# Patient Record
Sex: Female | Born: 1940 | Race: White | Hispanic: No | Smoking: Never smoker
Health system: Southern US, Community
[De-identification: ages and names within clinical notes are randomized; demographics above are authoritative.]

## PROBLEM LIST (undated history)

## (undated) DIAGNOSIS — F0390 Unspecified dementia without behavioral disturbance: Secondary | ICD-10-CM

## (undated) HISTORY — PX: ABDOMINAL HYSTERECTOMY: SHX81

## (undated) HISTORY — PX: APPENDECTOMY: SHX54

---

## 2015-07-25 DIAGNOSIS — M47816 Spondylosis without myelopathy or radiculopathy, lumbar region: Secondary | ICD-10-CM | POA: Diagnosis present

## 2015-11-04 DIAGNOSIS — I10 Essential (primary) hypertension: Secondary | ICD-10-CM | POA: Diagnosis present

## 2019-11-22 DIAGNOSIS — F0390 Unspecified dementia without behavioral disturbance: Secondary | ICD-10-CM | POA: Diagnosis present

## 2021-03-02 ENCOUNTER — Other Ambulatory Visit: Payer: Self-pay

## 2021-03-02 ENCOUNTER — Encounter: Payer: Self-pay | Admitting: Emergency Medicine

## 2021-03-02 ENCOUNTER — Emergency Department
Admission: EM | Admit: 2021-03-02 | Discharge: 2021-03-02 | Disposition: A | Discharge status: Home / Self Care | Payer: Medicare Other | Attending: Emergency Medicine | Admitting: Emergency Medicine

## 2021-03-02 DIAGNOSIS — R531 Weakness: Secondary | ICD-10-CM | POA: Diagnosis not present

## 2021-03-02 DIAGNOSIS — T148XXA Other injury of unspecified body region, initial encounter: Secondary | ICD-10-CM

## 2021-03-02 DIAGNOSIS — S80212A Abrasion, left knee, initial encounter: Secondary | ICD-10-CM | POA: Insufficient documentation

## 2021-03-02 DIAGNOSIS — Y92129 Unspecified place in nursing home as the place of occurrence of the external cause: Secondary | ICD-10-CM | POA: Insufficient documentation

## 2021-03-02 DIAGNOSIS — S8991XA Unspecified injury of right lower leg, initial encounter: Secondary | ICD-10-CM | POA: Diagnosis present

## 2021-03-02 DIAGNOSIS — S81811A Laceration without foreign body, right lower leg, initial encounter: Secondary | ICD-10-CM | POA: Diagnosis not present

## 2021-03-02 DIAGNOSIS — R55 Syncope and collapse: Secondary | ICD-10-CM | POA: Insufficient documentation

## 2021-03-02 DIAGNOSIS — Y9301 Activity, walking, marching and hiking: Secondary | ICD-10-CM | POA: Diagnosis not present

## 2021-03-02 DIAGNOSIS — W19XXXA Unspecified fall, initial encounter: Secondary | ICD-10-CM | POA: Diagnosis not present

## 2021-03-02 LAB — CBC
HCT: 33 % — ABNORMAL LOW (ref 36.0–46.0)
Hemoglobin: 10.7 g/dL — ABNORMAL LOW (ref 12.0–15.0)
MCH: 33.1 pg (ref 26.0–34.0)
MCHC: 32.4 g/dL (ref 30.0–36.0)
MCV: 102.2 fL — ABNORMAL HIGH (ref 80.0–100.0)
Platelets: 259 10*3/uL (ref 150–400)
RBC: 3.23 MIL/uL — ABNORMAL LOW (ref 3.87–5.11)
RDW: 13.5 % (ref 11.5–15.5)
WBC: 9.3 10*3/uL (ref 4.0–10.5)
nRBC: 0 % (ref 0.0–0.2)

## 2021-03-02 LAB — COMPREHENSIVE METABOLIC PANEL
ALT: 9 U/L (ref 0–44)
AST: 20 U/L (ref 15–41)
Albumin: 3.8 g/dL (ref 3.5–5.0)
Alkaline Phosphatase: 43 U/L (ref 38–126)
Anion gap: 2 — ABNORMAL LOW (ref 5–15)
BUN: 23 mg/dL (ref 8–23)
CO2: 31 mmol/L (ref 22–32)
Calcium: 9.9 mg/dL (ref 8.9–10.3)
Chloride: 107 mmol/L (ref 98–111)
Creatinine, Ser: 1.23 mg/dL — ABNORMAL HIGH (ref 0.44–1.00)
GFR, Estimated: 45 mL/min — ABNORMAL LOW (ref >60)
Glucose, Bld: 99 mg/dL (ref 70–99)
Potassium: 3.9 mmol/L (ref 3.5–5.1)
Sodium: 140 mmol/L (ref 135–145)
Total Bilirubin: 0.6 mg/dL (ref 0.3–1.2)
Total Protein: 6.6 g/dL (ref 6.5–8.1)

## 2021-03-02 MED ORDER — SODIUM CHLORIDE 0.9 % IV BOLUS
1000.0000 mL | Freq: Once | INTRAVENOUS | Status: AC
  Administered 2021-03-02: 1000 mL via INTRAVENOUS

## 2021-03-02 NOTE — ED Triage Notes (Signed)
Pt via POV from [facility], pt was a visiting her daughter. Pt had a mechanical fall while walking outside. Pt has abrasion to the L knee and R wrist. Per EMS, pt had a second and EMS states she was orthostatic. Pt is A&Ox4 and NAD.

## 2021-03-02 NOTE — ED Triage Notes (Signed)
First Nurse Note:  Arrives via [facility] from [facility].  Fall while outside walking.  Per report left skin tear to left knee, right wrist.  EMS reports patient is orthostatic.    CBG:  161  AOx3

## 2021-03-02 NOTE — Discharge Instructions (Addendum)
Please drink plenty of fluids over the next several days.  Please keep your abrasions covered with Neosporin or other antiseptic ointment for the next 7 days.  Please follow-up with your doctor in 2 to 3 days for recheck/reevaluation.  Return to the emergency department for any chest pain, any further episodes of weakness, fever, any chest pain, or any other symptom personally concerning to yourself.

## 2021-03-02 NOTE — ED Provider Notes (Signed)
[facility] Emergency Department Provider Note  Time seen: 3:26 PM  I have reviewed the triage vital signs and the nursing notes.   HISTORY  Chief Complaint Fall   HPI Sara Ho is a 80 y.o. female presents to the emergency department from a nursing facility after a fall.  According to the patient and reports she was outside walking around when she became very weak feeling.  Patient states she went down to her knees suffered an abrasion to her right wrist and her left knee.  Patient states she did not hit her head.  No LOC.  No headache or nausea.  EMS states patient was orthostatic and they started 500 cc of fluid.  Here the patient is awake alert oriented, no distress calm cooperative and pleasant.  Patient has no complaints besides mild discomfort at the areas of the abrasions.  Great range of motion all extremities.  Patient states she was ambulatory after the fall.   History reviewed. No pertinent past medical history.  There are no problems to display for this patient.   Past Surgical History:  Procedure Laterality Date   ABDOMINAL HYSTERECTOMY     APPENDECTOMY      Prior to Admission medications   Not on File    No Known Allergies  History reviewed. No pertinent family history.  Social History Social History   Tobacco Use   Smoking status: Never   Smokeless tobacco: Never  Substance Use Topics   Alcohol use: Not Currently   Drug use: Not Currently    Review of Systems Constitutional: Negative for fever. Cardiovascular: Negative for chest pain. Respiratory: Negative for shortness of breath. Gastrointestinal: Negative for abdominal pain, vomiting and diarrhea. Genitourinary: Negative for urinary compaints Musculoskeletal: Negative for musculoskeletal complaints Neurological: Negative for headache All other ROS negative  ____________________________________________   PHYSICAL EXAM:  VITAL SIGNS: ED Triage Vitals [03/02/21  1450]  Enc Vitals Group     BP 115/89     Pulse Rate 96     Resp 20     Temp 98.2 F (36.8 C)     Temp Source Oral     SpO2 95 %     Weight 140 lb (63.5 kg)     Height 5\' 6"  (1.676 m)     Head Circumference      Peak Flow      Pain Score 0     Pain Loc      Pain Edu?      Excl. in GC?    Constitutional: Alert and oriented. Well appearing and in no distress. Eyes: Normal exam ENT      Head: Normocephalic and atraumatic.      Mouth/Throat: Mucous membranes are moist. Cardiovascular: Normal rate, regular rhythm.  Respiratory: Normal respiratory effort without tachypnea nor retractions. Breath sounds are clear  Gastrointestinal: Soft and nontender. No distention.   Musculoskeletal: Nontender with normal range of motion in all extremities.  Good range of motion in all extremities without pain. Neurologic:  Normal speech and language. No gross focal neurologic deficits Skin: Small abrasion to left knee.  Small skin tear to right wrist Psychiatric: Mood and affect are normal.  ____________________________________________    EKG  EKG viewed and interpreted by myself shows a normal sinus rhythm at 61 bpm with a narrow QRS, normal axis, normal intervals, no concerning ST changes.  ____________________________________________   INITIAL IMPRESSION / ASSESSMENT AND PLAN / ED COURSE  Pertinent labs & imaging results that were  available during my care of the patient were reviewed by me and considered in my medical decision making (see chart for details).   Patient presents to the emergency department after an episode of weakness leading to a fall.  Denies hitting her head or LOC.  No blood thinners.  Patient found to be orthostatic by EMS and fluids were initiated and the patient was brought to the emergency department.  Here the patient is awake alert oriented she has no complaints besides mild discomfort to her right wrist and left knee where she has small abrasions.  However given the  patient's episode of weakness and orthostasis we will continue IV hydration, check labs, urinalysis and EKG and continue to closely monitor.  Patient's lab work is reassuring.  No significant findings.  Patient has received IV hydration in the emergency department.  Patient feels well.  We will discharge home with PCP follow-up.  Patient agreeable to plan of care.  Sara Ho was evaluated in Emergency Department on 03/02/2021 for the symptoms described in the history of present illness. She was evaluated in the context of the global COVID-19 pandemic, which necessitated consideration that the patient might be at risk for infection with the SARS-CoV-2 virus that causes COVID-19. Institutional protocols and algorithms that pertain to the evaluation of patients at risk for COVID-19 are in a state of rapid change based on information released by regulatory bodies including the CDC and federal and state organizations. These policies and algorithms were followed during the patient's care in the ED.  ____________________________________________   FINAL CLINICAL IMPRESSION(S) / ED DIAGNOSES  Weakness Near syncope   [name], MD 03/02/21 1653

## 2021-04-08 ENCOUNTER — Other Ambulatory Visit: Payer: Self-pay

## 2021-04-08 ENCOUNTER — Inpatient Hospital Stay
Admission: EM | Admit: 2021-04-08 | Discharge: 2021-04-13 | DRG: 947 | Disposition: A | Discharge status: Home / Self Care | Payer: Medicare Other | Attending: Internal Medicine | Admitting: Internal Medicine

## 2021-04-08 ENCOUNTER — Encounter: Payer: Self-pay | Admitting: *Deleted

## 2021-04-08 ENCOUNTER — Emergency Department: Payer: Medicare Other

## 2021-04-08 DIAGNOSIS — I129 Hypertensive chronic kidney disease with stage 1 through stage 4 chronic kidney disease, or unspecified chronic kidney disease: Secondary | ICD-10-CM | POA: Diagnosis present

## 2021-04-08 DIAGNOSIS — R627 Adult failure to thrive: Secondary | ICD-10-CM | POA: Diagnosis present

## 2021-04-08 DIAGNOSIS — E870 Hyperosmolality and hypernatremia: Secondary | ICD-10-CM | POA: Diagnosis present

## 2021-04-08 DIAGNOSIS — Z7982 Long term (current) use of aspirin: Secondary | ICD-10-CM

## 2021-04-08 DIAGNOSIS — I10 Essential (primary) hypertension: Secondary | ICD-10-CM | POA: Diagnosis present

## 2021-04-08 DIAGNOSIS — F0390 Unspecified dementia without behavioral disturbance: Secondary | ICD-10-CM | POA: Diagnosis present

## 2021-04-08 DIAGNOSIS — R531 Weakness: Secondary | ICD-10-CM

## 2021-04-08 DIAGNOSIS — N1831 Chronic kidney disease, stage 3a: Secondary | ICD-10-CM | POA: Diagnosis present

## 2021-04-08 DIAGNOSIS — R778 Other specified abnormalities of plasma proteins: Secondary | ICD-10-CM | POA: Diagnosis present

## 2021-04-08 DIAGNOSIS — E785 Hyperlipidemia, unspecified: Secondary | ICD-10-CM | POA: Diagnosis present

## 2021-04-08 DIAGNOSIS — U099 Post covid-19 condition, unspecified: Secondary | ICD-10-CM | POA: Diagnosis present

## 2021-04-08 DIAGNOSIS — F05 Delirium due to known physiological condition: Secondary | ICD-10-CM | POA: Diagnosis present

## 2021-04-08 DIAGNOSIS — J9819 Other pulmonary collapse: Secondary | ICD-10-CM | POA: Diagnosis present

## 2021-04-08 DIAGNOSIS — J432 Centrilobular emphysema: Secondary | ICD-10-CM | POA: Diagnosis present

## 2021-04-08 DIAGNOSIS — J690 Pneumonitis due to inhalation of food and vomit: Secondary | ICD-10-CM | POA: Diagnosis present

## 2021-04-08 DIAGNOSIS — J181 Lobar pneumonia, unspecified organism: Secondary | ICD-10-CM

## 2021-04-08 DIAGNOSIS — M5136 Other intervertebral disc degeneration, lumbar region: Secondary | ICD-10-CM | POA: Diagnosis present

## 2021-04-08 DIAGNOSIS — M47816 Spondylosis without myelopathy or radiculopathy, lumbar region: Secondary | ICD-10-CM | POA: Diagnosis present

## 2021-04-08 DIAGNOSIS — E639 Nutritional deficiency, unspecified: Secondary | ICD-10-CM | POA: Diagnosis not present

## 2021-04-08 DIAGNOSIS — Z6822 Body mass index (BMI) 22.0-22.9, adult: Secondary | ICD-10-CM

## 2021-04-08 DIAGNOSIS — Z79899 Other long term (current) drug therapy: Secondary | ICD-10-CM | POA: Diagnosis not present

## 2021-04-08 DIAGNOSIS — J1282 Pneumonia due to coronavirus disease 2019: Secondary | ICD-10-CM | POA: Diagnosis present

## 2021-04-08 DIAGNOSIS — Z66 Do not resuscitate: Secondary | ICD-10-CM | POA: Diagnosis present

## 2021-04-08 DIAGNOSIS — I160 Hypertensive urgency: Secondary | ICD-10-CM

## 2021-04-08 DIAGNOSIS — U071 COVID-19: Secondary | ICD-10-CM

## 2021-04-08 HISTORY — DX: Unspecified dementia, unspecified severity, without behavioral disturbance, psychotic disturbance, mood disturbance, and anxiety: F03.90

## 2021-04-08 LAB — BASIC METABOLIC PANEL
Anion gap: 13 (ref 5–15)
BUN: 42 mg/dL — ABNORMAL HIGH (ref 8–23)
CO2: 23 mmol/L (ref 22–32)
Calcium: 10.5 mg/dL — ABNORMAL HIGH (ref 8.9–10.3)
Chloride: 109 mmol/L (ref 98–111)
Creatinine, Ser: 1.05 mg/dL — ABNORMAL HIGH (ref 0.44–1.00)
GFR, Estimated: 55 mL/min — ABNORMAL LOW (ref >60)
Glucose, Bld: 130 mg/dL — ABNORMAL HIGH (ref 70–99)
Potassium: 3.7 mmol/L (ref 3.5–5.1)
Sodium: 145 mmol/L (ref 135–145)

## 2021-04-08 LAB — CBC
HCT: 41.3 % (ref 36.0–46.0)
Hemoglobin: 13.4 g/dL (ref 12.0–15.0)
MCH: 32.9 pg (ref 26.0–34.0)
MCHC: 32.4 g/dL (ref 30.0–36.0)
MCV: 101.5 fL — ABNORMAL HIGH (ref 80.0–100.0)
Platelets: 231 10*3/uL (ref 150–400)
RBC: 4.07 MIL/uL (ref 3.87–5.11)
RDW: 11.8 % (ref 11.5–15.5)
WBC: 7.8 10*3/uL (ref 4.0–10.5)
nRBC: 0 % (ref 0.0–0.2)

## 2021-04-08 LAB — TROPONIN I (HIGH SENSITIVITY)
Troponin I (High Sensitivity): 30 ng/L — ABNORMAL HIGH (ref <18)
Troponin I (High Sensitivity): 31 ng/L — ABNORMAL HIGH (ref <18)

## 2021-04-08 LAB — HEPATIC FUNCTION PANEL
ALT: 13 U/L (ref 0–44)
AST: 26 U/L (ref 15–41)
Albumin: 4.1 g/dL (ref 3.5–5.0)
Alkaline Phosphatase: 45 U/L (ref 38–126)
Bilirubin, Direct: 0.2 mg/dL (ref 0.0–0.2)
Indirect Bilirubin: 1.2 mg/dL — ABNORMAL HIGH (ref 0.3–0.9)
Total Bilirubin: 1.4 mg/dL — ABNORMAL HIGH (ref 0.3–1.2)
Total Protein: 8 g/dL (ref 6.5–8.1)

## 2021-04-08 LAB — PROCALCITONIN: Procalcitonin: <0.1 ng/mL

## 2021-04-08 MED ORDER — ONDANSETRON HCL 4 MG/2ML IJ SOLN
4.0000 mg | Freq: Once | INTRAMUSCULAR | Status: AC
  Administered 2021-04-08: 4 mg via INTRAVENOUS
  Filled 2021-04-08: qty 2

## 2021-04-08 MED ORDER — IOHEXOL 350 MG/ML SOLN
100.0000 mL | Freq: Once | INTRAVENOUS | Status: AC | PRN
  Administered 2021-04-08: 100 mL via INTRAVENOUS

## 2021-04-08 MED ORDER — SODIUM CHLORIDE 0.9 % IV BOLUS
500.0000 mL | Freq: Once | INTRAVENOUS | Status: AC
  Administered 2021-04-08: 500 mL via INTRAVENOUS

## 2021-04-08 NOTE — ED Notes (Signed)
Patient to CT at this time

## 2021-04-08 NOTE — ED Provider Notes (Signed)
[facility] Emergency Department Provider Note  ____________________________________________   Event Date/Time   First MD Initiated Contact with Patient 04/08/21 2136     (approximate)  I have reviewed the triage vital signs and the nursing notes.   HISTORY  Chief Complaint Weakness    HPI Sara Ho is a 80 y.o. female with demntia, HTN and CKD who is otherwise healthy who comes in with concerns for weakness.  Patient was COVID-positive last Wednesday, 1 week ago.  Symptoms started Tuesday (8 days ago) and they noted her to be febrile. Concern from family that she is not eating.  Room air sat 92%. Family concerned that she is not eating or drinking as much.  Was initially getting better then Sunday more agitated, weak, tired. She is in independent living. She uses a walker to ambulate.  Daughter says she is going in twice a day to try and help her.    History reviewed. No pertinent past medical history.  There are no problems to display for this patient.   Past Surgical History:  Procedure Laterality Date   ABDOMINAL HYSTERECTOMY     APPENDECTOMY      Prior to Admission medications   Not on File    Allergies Patient has no known allergies.  No family history on file.  Social History Social History   Tobacco Use   Smoking status: Never   Smokeless tobacco: Never  Substance Use Topics   Alcohol use: Not Currently   Drug use: Not Currently      Review of Systems Constitutional: No fever/chills, weakness  Eyes: No visual changes. ENT: No sore throat. Cardiovascular: Denies chest pain. Respiratory: Denies shortness of breath. Gastrointestinal: No abdominal pain.  No nausea, no vomiting.  No diarrhea.  No constipation. Genitourinary: Negative for dysuria. Musculoskeletal: Negative for back pain. Skin: Negative for rash. Neurological: Negative for headaches, focal weakness or numbness. All other ROS  negative ____________________________________________   PHYSICAL EXAM:  VITAL SIGNS: ED Triage Vitals  Enc Vitals Group     BP 04/08/21 1910 (!) 249/208     Pulse Rate 04/08/21 1910 (!) 106     Resp 04/08/21 1910 18     Temp 04/08/21 1910 99 F (37.2 C)     Temp Source 04/08/21 1910 Oral     SpO2 04/08/21 1910 92 %     Weight 04/08/21 1912 140 lb (63.5 kg)     Height 04/08/21 1912 5\' 6"  (1.676 m)     Head Circumference --      Peak Flow --      Pain Score 04/08/21 1912 0     Pain Loc --      Pain Edu? --      Excl. in GC? --     Constitutional:  Well appearing and in no acute distress. Eyes: Conjunctivae are normal. EOMI. Head: Atraumatic. Nose: No congestion/rhinnorhea. Mouth/Throat: Mucous membranes are moist.   Neck: No stridor. Trachea Midline. FROM Cardiovascular: Normal rate, regular rhythm. Grossly normal heart sounds.  Good peripheral circulation. Respiratory: Normal respiratory effort.  No retractions. Lungs CTAB. Gastrointestinal: Soft and nontender. No distention. No abdominal bruits.  Musculoskeletal: No lower extremity tenderness nor edema.  No joint effusions. Neurologic:  Normal speech and language. No gross focal neurologic deficits are appreciated.  Skin:  Skin is warm, dry and intact. No rash noted. Psychiatric: Mood and affect are normal. Speech and behavior are normal. GU: Deferred   ____________________________________________   LABS (all labs  ordered are listed, but only abnormal results are displayed)  Labs Reviewed  BASIC METABOLIC PANEL - Abnormal; Notable for the following components:      Result Value   Glucose, Bld 130 (*)    BUN 42 (*)    Creatinine, Ser 1.05 (*)    Calcium 10.5 (*)    GFR, Estimated 55 (*)    All other components within normal limits  CBC - Abnormal; Notable for the following components:   MCV 101.5 (*)    All other components within normal limits  TROPONIN I (HIGH SENSITIVITY) - Abnormal; Notable for the  following components:   Troponin I (High Sensitivity) 30 (*)    All other components within normal limits  TROPONIN I (HIGH SENSITIVITY)   ____________________________________________   ED ECG REPORT I, Concha SeMary E Siarra Gilkerson, the attending physician, personally viewed and interpreted this ECG.  Normal sinus rate of 84, no ST elevation, no T wave inversions, normal intervals ____________________________________________  RADIOLOGY Vela ProseI, Jt Brabec E Tremond Shimabukuro, personally viewed and evaluated these images (plain radiographs) as part of my medical decision making, as well as reviewing the written report by the radiologist.  ED MD interpretation: Concern for possible pneumonia  Official radiology report(s): DG Chest 2 View  Result Date: 04/08/2021 CLINICAL DATA:  Weakness EXAM: CHEST - 2 VIEW COMPARISON:  09/07/2020 FINDINGS: Airspace disease at the left lung base. Probable small left effusion. Normal cardiac size. Aortic atherosclerosis. No pneumothorax. IMPRESSION: Left lung base consolidation, probably representing pneumonia. Small left effusion. Recommend radiographic follow-up to resolution. Electronically Signed   By: Jasmine PangKim  Fujinaga M.D.   On: 04/08/2021 19:48   CT Head Wo Contrast  Result Date: 04/08/2021 CLINICAL DATA:  Altered mental status.  COVID positive last week. EXAM: CT HEAD WITHOUT CONTRAST TECHNIQUE: Contiguous axial images were obtained from the base of the skull through the vertex without intravenous contrast. COMPARISON:  Brain MRI 09/30/2020 FINDINGS: Brain: No evidence of acute infarction, hemorrhage, hydrocephalus, extra-axial collection or mass lesion/mass effect. Generalized atrophy is stable from prior exam. There is advanced periventricular and deep chronic small vessel ischemia, also stable. 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 Vascular: No hyperdense vessel Skull: No fracture or focal lesion. Sinuses/Orbits: Occasional mucosal thickening of  ethmoid air cells and left side of sphenoid sinus. No sinus fluid levels. No mastoid effusion. Bilateral cataract resection Other: None. IMPRESSION: 1. No acute intracranial abnormality. 2. Stable atrophy and chronic small vessel ischemia. Electronically Signed   By: Narda RutherfordMelanie  Sanford M.D.   On: 04/08/2021 22:41   CT Angio Chest PE W and/or Wo Contrast  Result Date: 04/08/2021 CLINICAL DATA:  High probability pulmonary embolism, COVID positive, malaise EXAM: CT ANGIOGRAPHY CHEST WITH CONTRAST TECHNIQUE: Multidetector CT imaging of the chest was performed using the standard protocol during bolus administration of intravenous contrast. Multiplanar CT image reconstructions and MIPs were obtained to evaluate the vascular anatomy. CONTRAST:  100mL OMNIPAQUE IOHEXOL 350 MG/ML SOLN COMPARISON:  None. FINDINGS: Cardiovascular: There is adequate opacification of the pulmonary arterial tree. No intraluminal filling defect identified to suggest acute pulmonary embolism. The central pulmonary arteries are of normal caliber. Minimal coronary artery calcification. Global cardiac size within normal limits. Moderate mixed atheromatous plaque within the thoracic aorta. The thoracic aorta is mildly dilated at the arch just beyond the takeoff of the left subclavian artery measuring 3.3 cm in diameter on coronal image # 46/7. Mediastinum/Nodes: Small hiatal hernia. The esophagus is unremarkable. Thyroid unremarkable. No pathologic thoracic adenopathy.  Lungs/Pleura: Moderate apically predominant centrilobular emphysema. There is complete collapse of the left lower lobe. No focal pulmonary infiltrate or nodule identified. No pneumothorax or pleural effusion. No definite central obstructing mass identified. There is impaction of the central airways of the left lower lobe. Upper Abdomen: No acute abnormality. Musculoskeletal: No acute bone abnormality. No lytic or blastic bone lesion identified. Review of the MIP images confirms the  above findings. IMPRESSION: No acute pulmonary embolism. Moderate centrilobular emphysema. Complete collapse of the left lower lobe with central airway impaction. No central obstructing mass lesion. 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 Recommend annual imaging followup by CTA or MRA. This recommendation follows 2010 ACCF/AHA/AATS/ACR/ASA/SCA/SCAI/SIR/STS/SVM Guidelines for the Diagnosis and Management of Patients with Thoracic Aortic Disease. Circulation.2010; 121: Q761-P509. Aortic aneurysm NOS (ICD10-I71.9) Mild coronary artery calcification. Aortic Atherosclerosis (ICD10-I70.0) and Emphysema (ICD10-J43.9). Aortic aneurysm NOS (ICD10-I71.9). Electronically Signed   By: Helyn Numbers MD   On: 04/08/2021 22:46    ____________________________________________   PROCEDURES  Procedure(s) performed (including Critical Care):  Procedures   ____________________________________________   INITIAL IMPRESSION / ASSESSMENT AND PLAN / ED COURSE  Sara Ho was evaluated in Emergency Department on 04/08/2021 for the symptoms described in the history of present illness. She was evaluated in the context of the global COVID-19 pandemic, which necessitated consideration that the patient might be at risk for infection with the SARS-CoV-2 virus that causes COVID-19. Institutional protocols and algorithms that pertain to the evaluation of patients at risk for COVID-19 are in a state of rapid change based on information released by regulatory bodies including the CDC and federal and state organizations. These policies and algorithms were followed during the patient's care in the ED.    Pt 7 days of covid with working weakness/sob.  Lives alone. Unsure if falls. Labs to evaluate for aki, electolyte abn, UTI xray to evaluate covid. Given xrya possible PNA will get CT PE given prolong covid course and CT head to rule out ich.  Kidney function, BUN slightly elevated no significant  electrolyte abnormalities.  White count is normal.  Initial troponin was elevated at 30  Pt weak with ambulating and desat to 91. Given this and pt is in independet living will d/w hospital for admission       ____________________________________________   FINAL CLINICAL IMPRESSION(S) / ED DIAGNOSES   Final diagnoses:  Weakness  COVID-19      MEDICATIONS GIVEN DURING THIS VISIT:  Medications  aspirin EC tablet 81 mg (has no administration in time range)  simvastatin (ZOCOR) tablet 40 mg (has no administration in time range)  oxybutynin (DITROPAN-XL) 24 hr tablet 10 mg (has no administration in time range)  vitamin B-12 (CYANOCOBALAMIN) tablet 1,000 mcg (has no administration in time range)  enoxaparin (LOVENOX) injection 40 mg (has no administration in time range)  acetaminophen (TYLENOL) tablet 650 mg (has no administration in time range)    Or  acetaminophen (TYLENOL) suppository 650 mg (has no administration in time range)  ondansetron (ZOFRAN) tablet 4 mg (has no administration in time range)    Or  ondansetron (ZOFRAN) injection 4 mg (has no administration in time range)  senna-docusate (Senokot-S) tablet 1 tablet (has no administration in time range)  ascorbic acid (VITAMIN C) tablet 500 mg (has no administration in time range)  zinc sulfate capsule 220 mg (has no administration in time range)  guaiFENesin-dextromethorphan (ROBITUSSIN DM) 100-10 MG/5ML syrup 10 mL (has no administration in time range)  hydrALAZINE (APRESOLINE) injection  10 mg (has no administration in time range)  sodium chloride 0.9 % bolus 500 mL (0 mLs Intravenous Stopped 04/09/21 0011)  ondansetron (ZOFRAN) injection 4 mg (4 mg Intravenous Given 04/08/21 2209)  iohexol (OMNIPAQUE) 350 MG/ML injection 100 mL (100 mLs Intravenous Contrast Given 04/08/21 2220)     ED Discharge Orders     None        Note:  This document was prepared using Dragon voice recognition software and may include  unintentional dictation errors.    Concha Se, MD 04/09/21 [phone]

## 2021-04-08 NOTE — ED Triage Notes (Signed)
Pt brought in via ems from [facility].  Pt tested positive for covid last week.  No cough, no chest pain, no sob.  Pt alert.  Pt states she doesn't know why she is here.

## 2021-04-08 NOTE — ED Notes (Signed)
Patient ambulated in room. O2 dropped to 91% on room air. Patient complaining of quickly being tired.

## 2021-04-08 NOTE — ED Triage Notes (Signed)
First Nurse Note:  Arrives from [facility].  Tested positive for COVID last Wednesday, patient states she continues to feel bad.  Also, daughter concerned patient not eating well.  T:  99.3  p: 78.  BP:  156/80  SpO2:92-93 Room air.  Patient only complaint is fatigue.  Per report, no SOB/ DOE.

## 2021-04-09 ENCOUNTER — Encounter: Payer: Self-pay | Admitting: Internal Medicine

## 2021-04-09 DIAGNOSIS — R531 Weakness: Secondary | ICD-10-CM | POA: Diagnosis not present

## 2021-04-09 DIAGNOSIS — J181 Lobar pneumonia, unspecified organism: Secondary | ICD-10-CM

## 2021-04-09 DIAGNOSIS — I160 Hypertensive urgency: Secondary | ICD-10-CM

## 2021-04-09 LAB — COMPREHENSIVE METABOLIC PANEL
ALT: 12 U/L (ref 0–44)
AST: 21 U/L (ref 15–41)
Albumin: 3.9 g/dL (ref 3.5–5.0)
Alkaline Phosphatase: 42 U/L (ref 38–126)
Anion gap: 10 (ref 5–15)
BUN: 40 mg/dL — ABNORMAL HIGH (ref 8–23)
CO2: 26 mmol/L (ref 22–32)
Calcium: 10.4 mg/dL — ABNORMAL HIGH (ref 8.9–10.3)
Chloride: 111 mmol/L (ref 98–111)
Creatinine, Ser: 0.95 mg/dL (ref 0.44–1.00)
GFR, Estimated: >60 mL/min (ref >60)
Glucose, Bld: 132 mg/dL — ABNORMAL HIGH (ref 70–99)
Potassium: 3.7 mmol/L (ref 3.5–5.1)
Sodium: 147 mmol/L — ABNORMAL HIGH (ref 135–145)
Total Bilirubin: 1.3 mg/dL — ABNORMAL HIGH (ref 0.3–1.2)
Total Protein: 7.7 g/dL (ref 6.5–8.1)

## 2021-04-09 LAB — CBC
HCT: 39.8 % (ref 36.0–46.0)
Hemoglobin: 13.1 g/dL (ref 12.0–15.0)
MCH: 33.2 pg (ref 26.0–34.0)
MCHC: 32.9 g/dL (ref 30.0–36.0)
MCV: 100.8 fL — ABNORMAL HIGH (ref 80.0–100.0)
Platelets: 214 10*3/uL (ref 150–400)
RBC: 3.95 MIL/uL (ref 3.87–5.11)
RDW: 11.9 % (ref 11.5–15.5)
WBC: 8.6 10*3/uL (ref 4.0–10.5)
nRBC: 0 % (ref 0.0–0.2)

## 2021-04-09 LAB — PROCALCITONIN: Procalcitonin: <0.1 ng/mL

## 2021-04-09 MED ORDER — VITAMIN B-12 1000 MCG PO TABS
1000.0000 ug | ORAL_TABLET | Freq: Every day | ORAL | Status: DC
  Administered 2021-04-09 – 2021-04-13 (×5): 1000 ug via ORAL
  Filled 2021-04-09 (×5): qty 1

## 2021-04-09 MED ORDER — SIMVASTATIN 20 MG PO TABS
10.0000 mg | ORAL_TABLET | Freq: Every day | ORAL | Status: DC
  Administered 2021-04-09 – 2021-04-13 (×5): 10 mg via ORAL
  Filled 2021-04-09 (×5): qty 1

## 2021-04-09 MED ORDER — ALBUTEROL SULFATE (2.5 MG/3ML) 0.083% IN NEBU: 2.5000 mg | INHALATION_SOLUTION | Freq: Two times a day (BID) | RESPIRATORY_TRACT | Status: DC

## 2021-04-09 MED ORDER — HYDRALAZINE HCL 20 MG/ML IJ SOLN: 10.0000 mg | INTRAMUSCULAR | Status: DC | PRN

## 2021-04-09 MED ORDER — AMOXICILLIN-POT CLAVULANATE 875-125 MG PO TABS
1.0000 | ORAL_TABLET | Freq: Two times a day (BID) | ORAL | Status: DC
  Administered 2021-04-09 – 2021-04-13 (×9): 1 via ORAL
  Filled 2021-04-09 (×9): qty 1

## 2021-04-09 MED ORDER — SIMVASTATIN 20 MG PO TABS: 40.0000 mg | ORAL_TABLET | Freq: Every day | ORAL | Status: DC

## 2021-04-09 MED ORDER — ACETAMINOPHEN 325 MG PO TABS
650.0000 mg | ORAL_TABLET | Freq: Four times a day (QID) | ORAL | Status: DC | PRN
  Administered 2021-04-09 – 2021-04-13 (×2): 650 mg via ORAL
  Filled 2021-04-09 (×2): qty 2

## 2021-04-09 MED ORDER — ASPIRIN EC 81 MG PO TBEC
81.0000 mg | DELAYED_RELEASE_TABLET | Freq: Every day | ORAL | Status: DC
  Administered 2021-04-09 – 2021-04-13 (×5): 81 mg via ORAL
  Filled 2021-04-09 (×5): qty 1

## 2021-04-09 MED ORDER — ZINC SULFATE 220 (50 ZN) MG PO CAPS
220.0000 mg | ORAL_CAPSULE | Freq: Every day | ORAL | Status: DC
  Administered 2021-04-09 – 2021-04-13 (×5): 220 mg via ORAL
  Filled 2021-04-09 (×5): qty 1

## 2021-04-09 MED ORDER — SODIUM CHLORIDE 3 % IN NEBU
4.0000 mL | INHALATION_SOLUTION | Freq: Two times a day (BID) | RESPIRATORY_TRACT | Status: DC
  Filled 2021-04-09 (×2): qty 4

## 2021-04-09 MED ORDER — ENOXAPARIN SODIUM 40 MG/0.4ML IJ SOSY
40.0000 mg | PREFILLED_SYRINGE | INTRAMUSCULAR | Status: DC
  Administered 2021-04-09 – 2021-04-12 (×4): 40 mg via SUBCUTANEOUS
  Filled 2021-04-09 (×4): qty 0.4

## 2021-04-09 MED ORDER — SODIUM CHLORIDE 0.45 % IV SOLN: INTRAVENOUS | Status: DC

## 2021-04-09 MED ORDER — OXYBUTYNIN CHLORIDE ER 10 MG PO TB24
10.0000 mg | ORAL_TABLET | Freq: Every day | ORAL | Status: DC
  Administered 2021-04-09 – 2021-04-13 (×5): 10 mg via ORAL
  Filled 2021-04-09 (×5): qty 1

## 2021-04-09 MED ORDER — ALBUTEROL SULFATE HFA 108 (90 BASE) MCG/ACT IN AERS
2.0000 | INHALATION_SPRAY | Freq: Two times a day (BID) | RESPIRATORY_TRACT | Status: DC
  Administered 2021-04-09 – 2021-04-13 (×9): 2 via RESPIRATORY_TRACT
  Filled 2021-04-09: qty 6.7

## 2021-04-09 MED ORDER — ONDANSETRON HCL 4 MG PO TABS: 4.0000 mg | ORAL_TABLET | Freq: Four times a day (QID) | ORAL | Status: DC | PRN

## 2021-04-09 MED ORDER — ACETAMINOPHEN 650 MG RE SUPP: 650.0000 mg | Freq: Four times a day (QID) | RECTAL | Status: DC | PRN

## 2021-04-09 MED ORDER — GUAIFENESIN-DM 100-10 MG/5ML PO SYRP: 10.0000 mL | ORAL_SOLUTION | ORAL | Status: DC | PRN

## 2021-04-09 MED ORDER — AMLODIPINE BESYLATE 5 MG PO TABS
5.0000 mg | ORAL_TABLET | Freq: Every day | ORAL | Status: DC
  Administered 2021-04-09: 5 mg via ORAL
  Filled 2021-04-09: qty 1

## 2021-04-09 MED ORDER — SENNOSIDES-DOCUSATE SODIUM 8.6-50 MG PO TABS: 1.0000 | ORAL_TABLET | Freq: Every evening | ORAL | Status: DC | PRN

## 2021-04-09 MED ORDER — ASCORBIC ACID 500 MG PO TABS
500.0000 mg | ORAL_TABLET | Freq: Every day | ORAL | Status: DC
  Administered 2021-04-09 – 2021-04-13 (×5): 500 mg via ORAL
  Filled 2021-04-09 (×5): qty 1

## 2021-04-09 MED ORDER — GABAPENTIN 600 MG PO TABS: 600.0000 mg | ORAL_TABLET | Freq: Two times a day (BID) | ORAL | Status: DC

## 2021-04-09 MED ORDER — ONDANSETRON HCL 4 MG/2ML IJ SOLN: 4.0000 mg | Freq: Four times a day (QID) | INTRAMUSCULAR | Status: DC | PRN

## 2021-04-09 NOTE — Consult Note (Addendum)
NAME:  Sara Ho, MRN:  [id number], DOB:  [birth year], LOS: 1 ADMISSION DATE:  04/08/2021, CONSULTATION DATE:  04/09/21 REFERRING MD:  Dr. Jarvis Newcomer, CHIEF COMPLAINT:  Left lower lobe collapse   History of Present Illness:  Sara Ho presented from a [facility] ALF with Covid-19 infection. Initial date of positivity 7/13. Chest imaging dense left lower lobe consolidation with air bronchograms and debris within the left mainstem bronchus consistent with dense lobar pneumonia versus aspiration. There are no available prior films for review, although report from [facility] in 08/2020 suggests that the left lower lobe was patent at that time.  Since admission, she has remained stable on room air. She has been afebrile and without leukocytosis. She has not received Covid-specific therapy or antimicrobial therapy.  Pertinent  Medical History  Dementia HTN CKD  Significant Hospital Events: Including procedures, antibiotic start and stop dates in addition to other pertinent events   7/20 Hospital admission  Interim History / Subjective:  N/A  Objective   Blood pressure (!) 162/75, pulse 65, temperature 98.8 F (37.1 C), temperature source Oral, resp. rate 18, height 5\' 6"  (1.676 m), weight 63.5 kg, SpO2 95 %.        Intake/Output Summary (Last 24 hours) at 04/09/2021 1257 Last data filed at 04/09/2021 1100 Gross per 24 hour  Intake 980 ml  Output --  Net 980 ml   Filed Weights   04/08/21 1912  Weight: 63.5 kg    Examination: General: pleasant elderly female in NAD HENT: pupils equal and reactive, Mallampati II Lungs: decreased at left base, no W/C/R Cardiovascular: RRR, no M/R/G Abdomen: soft, non-tender, non-distended Extremities: no C/C/E Neuro: no focal deficits GU: deferred  Resolved Hospital Problem list   N/A  Assessment & Plan:  Covid-19 infection Dense left lower lobe pneumonia versus aspiration Mild pulmonary emphysema  - Agree with withhold Covid-specific  therapies given her lack of oxygen requirement - No role for bronchoscopy given active Covid and clinical stability - Administer albuterol + 3% hypertonic saline nebs BID prior to chest PT - Start aggressive chest physiotherapy 6x/day: - LEFT side up, Trendelenburg positioning  - Use chest vest AND bed to assist with mobilizing secretions - Would be reasonable to provide 7 day course of Augmentin - Recommend swallow evaluation - Would ensure that repeat CXR obtained in 4 weeks to ensure resolution  Thank you for the consultation. Pulmonology will sign off. Please let me know if there are any additional questions or concern.  Labs   CBC: Recent Labs  Lab 04/08/21 1916 04/09/21 0553  WBC 7.8 8.6  HGB 13.4 13.1  HCT 41.3 39.8  MCV 101.5* 100.8*  PLT 231 214    Basic Metabolic Panel: Recent Labs  Lab 04/08/21 1916 04/09/21 0553  NA 145 147*  K 3.7 3.7  CL 109 111  CO2 23 26  GLUCOSE 130* 132*  BUN 42* 40*  CREATININE 1.05* 0.95  CALCIUM 10.5* 10.4*   GFR: Estimated Creatinine Clearance: 46.4 mL/min (by C-G formula based on SCr of 0.95 mg/dL). Recent Labs  Lab 04/08/21 1916 04/08/21 2136 04/09/21 0553  PROCALCITON  --  <0.10 <0.10  WBC 7.8  --  8.6    Liver Function Tests: Recent Labs  Lab 04/08/21 2136 04/09/21 0553  AST 26 21  ALT 13 12  ALKPHOS 45 42  BILITOT 1.4* 1.3*  PROT 8.0 7.7  ALBUMIN 4.1 3.9   No results for input(s): LIPASE, AMYLASE in the last 168 hours. No  results for input(s): AMMONIA in the last 168 hours.  ABG No results found for: PHART, PCO2ART, PO2ART, HCO3, TCO2, ACIDBASEDEF, O2SAT   Coagulation Profile: No results for input(s): INR, PROTIME in the last 168 hours.  Cardiac Enzymes: No results for input(s): CKTOTAL, CKMB, CKMBINDEX, TROPONINI in the last 168 hours.  HbA1C: No results found for: HGBA1C  CBG: No results for input(s): GLUCAP in the last 168 hours.  Review of Systems:   Pertinent findings noted in HPI. All  other systems reviewed and are negative.  Past Medical History:  She,  has a past medical history of Dementia (HCC).   Surgical History:   Past Surgical History:  Procedure Laterality Date   ABDOMINAL HYSTERECTOMY     APPENDECTOMY       Social History:   reports that she has never smoked. She has never used smokeless tobacco. She reports previous alcohol use. She reports previous drug use.   Family History:  Her family history is not on file.   Allergies Allergies  Allergen Reactions   Hydrocodone Bit-Homatrop Mbr Nausea Only    With Liquid Preparation Only; Patient reportedly can take the pill form of Hydrocodone without any side effects      Home Medications  Prior to Admission medications   Medication Sig Start Date End Date Taking? Authorizing Provider  acetaminophen (TYLENOL) 500 MG tablet Take 1,000 mg by mouth in the morning and at bedtime.   Yes [provider]  aspirin 81 MG EC tablet Take 81 mg by mouth daily.   Yes [provider]  Calcium Carb-Cholecalciferol 600-400 MG-UNIT TABS Take 1 tablet by mouth in the morning and at bedtime.   Yes [provider]  cyanocobalamin 1000 MCG tablet Take 1,000 mcg by mouth daily.   Yes [provider]  gabapentin (NEURONTIN) 600 MG tablet Take 600 mg by mouth in the morning and at bedtime. 07/12/16  Yes [provider]  ipratropium (ATROVENT) 0.03 % nasal spray Place 1 spray into the nose 2 (two) times daily as needed. 03/16/17  Yes [provider]  oxybutynin (DITROPAN-XL) 10 MG 24 hr tablet Take 1 tablet by mouth daily. 03/22/17  Yes [provider]  simvastatin (ZOCOR) 40 MG tablet Take 1 tablet by mouth daily. 03/22/17  Yes [provider]    Marcelo Baldy, MD 04/09/21 1:11 PM

## 2021-04-09 NOTE — Evaluation (Signed)
Occupational Therapy Evaluation Patient Details Name: Kathren Scearce MRN: [id number] DOB: [birth year] Today's Date: 04/09/2021    History of Present Illness 80 y.o. female with medical history significant for HTN not on medication, lumbar DDD, dementia, CKD stage IIIa, diagnosed with COVID on 7/13 after developing a fever and cough, since improving who was sent to the emergency room due to generalized weakness and poor oral intake that is persistent in spite of improving cough.  Most of the history is taken from patient's daughter due to her history of dementia.  She states at baseline patient is interactive and will walk around and go down to the dining room at the assisted living.  Over the past week her oral intake went from 2 meals per day to just occasional bites and over the past 3 days, patient has spent most of the day in bed which is not usual for her.   Clinical Impression   Patient presenting with decreased I in self care, balance, functional mobility/transfer, endurance, cognition, and safety awareness. Pt oriented to self and DOB only. Per chart review, pt lives at [facility] and is independent at baseline. On assessment, pt clutching banana in hand and unable to verbalize where she was, where she lives, date/month/year, or her daughters name. She becomes increasingly annoyed/upset by therapist asking her to engage in self care/OOB activity. Pt needing no assist but mod multimodal cuing and over 10 minutes to get to EOB. Pt standing by pulling up on sink and then returned to bed. Pt reports not feeling well but unable to verbalize any details. RN and MD notified in change in cognition. Bed alarm set for safety.Patient will benefit from acute OT to increase overall independence in the areas of ADLs, functional mobility, and safety awareness in order to safely discharge to next venue of care.     Follow Up Recommendations  Supervision/Assistance - 24 hour;SNF    Equipment  Recommendations  Other (comment) (defer to next venue of care)       Precautions / Restrictions Precautions Precautions: Fall      Mobility Bed Mobility Overal bed mobility: Needs Assistance Bed Mobility: Supine to Sit;Sit to Supine     Supine to sit: Supervision Sit to supine: Supervision   General bed mobility comments: mod multimodal cuing and increased time    Transfers Overall transfer level: Needs assistance Equipment used: None Transfers: Sit to/from Stand Sit to Stand: Min assist         General transfer comment: staff report pt has been independent in room. Pt reaching forward and pulling self up from bed with use of sink with increased effort.        ADL either performed or assessed with clinical judgement   ADL                                         General ADL Comments: limited evaluation secondary to pt feeling unwell. Pt did perform bed mobility with supervison and cuing for technique. Pt unable to follow commands for other tasks and repeats, " i just don't feel well" but is unable to describe.     Vision Patient Visual Report: No change from baseline              Pertinent Vitals/Pain Pain Assessment: Faces Faces Pain Scale: Hurts a little bit Pain Location: generalized - unable to describe Pain Intervention(s): Limited  activity within patient's tolerance;Monitored during session     Hand Dominance Right   Extremity/Trunk Assessment Upper Extremity Assessment Upper Extremity Assessment: Generalized weakness   Lower Extremity Assessment Lower Extremity Assessment: Generalized weakness       Communication Communication Communication: Expressive difficulties   Cognition Arousal/Alertness: Awake/alert Behavior During Therapy: Restless;Flat affect Overall Cognitive Status: No family/caregiver present to determine baseline cognitive functioning                                 General Comments: No family  present during session. Pt with hx of dementia. Staff reporting pt has been interactive and oriented. Pt was very slow to respond to questions if at all during session and only verbalized name and DOB. Unable to verbalize her daughter's name or where she lives etc. Slow to process and sequence.   General Comments               Home Living Family/patient expects to be discharged to:: Assisted living                                        Prior Functioning/Environment Level of Independence: Independent        Comments: Pt does have hx of dementia but per chart review she normally is very ambulatory and performs her own self care task independently. This session pt with increased confusion and unable to answer most of questions.        OT Problem List: Decreased strength;Decreased activity tolerance;Impaired balance (sitting and/or standing);Decreased safety awareness;Decreased cognition;Decreased knowledge of use of DME or AE      OT Treatment/Interventions: Self-care/ADL training;Manual therapy;Therapeutic exercise;Patient/family education;Balance training;Energy conservation;Therapeutic activities;Cognitive remediation/compensation;DME and/or AE instruction    OT Goals(Current goals can be found in the care plan section) Acute Rehab OT Goals Patient Stated Goal: none stated OT Goal Formulation: Patient unable to participate in goal setting Time For Goal Achievement: 04/23/21 ADL Goals Pt Will Perform Grooming: with modified independence Pt Will Perform Lower Body Dressing: with supervision;sit to/from stand Pt Will Transfer to Toilet: with supervision;ambulating Pt Will Perform Toileting - Clothing Manipulation and hygiene: with supervision;sit to/from stand  OT Frequency: Min 2X/week   Barriers to D/C:    none known at this time          AM-PAC OT "6 Clicks" Daily Activity     Outcome Measure Help from another person eating meals?: A Little Help from  another person taking care of personal grooming?: A Little Help from another person toileting, which includes using toliet, bedpan, or urinal?: A Little Help from another person bathing (including washing, rinsing, drying)?: A Little Help from another person to put on and taking off regular upper body clothing?: A Little Help from another person to put on and taking off regular lower body clothing?: A Little 6 Click Score: 18   End of Session Nurse Communication: Mobility status;Precautions (contacted MD and RN based on cognitive change and concern)  Activity Tolerance: Patient limited by fatigue Patient left: in bed;with call bell/phone within reach;with bed alarm set  OT Visit Diagnosis: Unsteadiness on feet (R26.81);Muscle weakness (generalized) (M62.81)                Time: 1030-1057 OT Time Calculation (min): 27 min Charges:  OT General Charges $OT Visit: 1 Visit OT Evaluation $OT Eval  Moderate Complexity: 1 Mod OT Treatments $Therapeutic Activity: 8-22 mins  Jackquline Denmark, MS, OTR/L , CBIS ascom [phone]  04/09/21, 11:21 AM

## 2021-04-09 NOTE — Progress Notes (Addendum)
PROGRESS NOTE  Brief Narrative: Sara Ho is a 80 y.o. female with a history of HTN not on medication, dementia, stage IIIa CKD, and covid-19 diagnosed 7/13 who presented to the ED with poor per oral intake and diffuse weakness despite improvement in cough. Afebrile, not hypoxic, but severely hypertensive at 249/208 in the ED. Blood work largely unremarkable including no leukocytosis, negative procalcitonin, and creatinine at baseline of 1.05. Troponin 30 and 31 without ST changes on ECG or complaints of chest pain. Head CT negative for acute changes. CTA chest revealed emphysema, collapse of LLL with central airway impaction, but no PE.   Subjective: Pt says "why am I even here?" Knows she's in a hospital but not the date or situation. Says she could eat if she had to but is not hungry. Denies local numbness or weakness. No chest pain or dyspnea.   Objective: BP (!) 170/83 (BP Location: Right Arm)   Pulse (!) 59   Temp 98.6 F (37 C) (Oral)   Resp 17   Ht 5\' 6"  (1.676 m)   Wt 63.5 kg   SpO2 93%   BMI 22.60 kg/m   Gen: No distress Pulm: Clear and nonlabored on room air, diminished at left base.   CV: RRR, no murmur, no JVD, no edema GI: Soft, NT, ND, +BS  Neuro: Alert and not oriented. No focal deficits on somewhat limited exam. Skin: No rashes, lesions or ulcers on visualized skin.  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 Suspected sequelae of covid-19 infection.  - Continue PT and OT efforts. May require higher level of care.  Poor per oral intake:  - Dietitian consulted.   Acute delirium  on chronic dementia:  - Delirium precautions - Minimize sedating medications - Minimize hospitalization duration as much as is reasonable.   Covid-19 infection: Dx 7/13 and has had improvement in respiratory symptoms, but some FTT.  - Continue isolation for 10 days as long as she stay afebrile. Would complete isolation 7/23. - No indication for steroids without hypoxia, outside scope of anticipated benefit from antiviral.   HTN urgency: No PTA antihypertensive medications.  - Will start norvasc 5mg  given persistent BP elevation. Continue prn hydralazine and monitor closely.   LLL collapse: PCT negative, respiratory status stable. Will not initiate abx at this time.  - PCCM consulted - Pulmonary toilet  Troponin elevation: Very mild and asymptomatic.  - No further inpatient work up is currently planned.   Lumbar DDD:  - Continue tylenol prn. No current complaints.   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  - Repeat imaging in 1 year (July 2023)  HLD: Decrease simvastatin dosing.  , MD Pager on amion 04/09/2021, 9:10 AM

## 2021-04-09 NOTE — Care Management Important Message (Signed)
Important Message  Patient Details  Name: Sara Ho MRN: [id number] Date of Birth: Jun 23, 1941   Medicare Important Message Given:  N/A - LOS <3 / Initial given by admissions  Initial Medicare IM reviewed by Lenice Pressman, Patient Access Associate on 04/09/2021 at 12:06am.   Johnell Comings 04/09/2021, 2:15 PM

## 2021-04-09 NOTE — Evaluation (Signed)
Physical Therapy Evaluation Patient Details Name: Sara Ho MRN: [id number] DOB: [birth year] Today's Date: 04/09/2021   History of Present Illness  80 y.o. female with medical history significant for HTN not on medication, lumbar DDD, dementia, CKD stage IIIa, diagnosed with COVID on 7/13 after developing a fever and cough, since improving who was sent to the emergency room due to generalized weakness and poor oral intake that is persistent in spite of improving cough.  Per notes (via daughter) at baseline patient is interactive and will walk around and go down to the dining room at the assisted living.  Over the past week her oral intake went from 2 meals per day to just occasional bites and over the past 3 days, patient has spent most of the day in bed which is not usual for her.  Clinical Impression  Pt very confused and unable to do any consistent meaningful interaction.  When here eyes were open she had distant/flat affect and though she followed some simple commands she could not answer questions (did not know where she was, date, her PLOF, etc).  Overall she seemed to have reasonable strength and mobility but ultimately did not seem to be close to her ostensible baseline of ad lib ambulation around [facility].  Pt tired and clearly not feeling like doing much.    Follow Up Recommendations Supervision/Assistance - 24 hour;SNF (per improved mental status she hopefully can return to [facility] and do in-house PT there)    Equipment Recommendations  None recommended by PT    Recommendations for Other Services       Precautions / Restrictions Precautions Precautions: Fall Restrictions Weight Bearing Restrictions: No      Mobility  Bed Mobility Overal bed mobility: Needs Assistance Bed Mobility: Supine to Sit;Sit to Supine     Supine to sit: Supervision Sit to supine: Supervision   General bed mobility comments: mod multimodal cuing and increased time secondary to confusion  but able to phyiscally perform    Transfers Overall transfer level: Needs assistance Equipment used: Rolling walker (2 wheeled) Transfers: Sit to/from Stand Sit to Stand: Min guard         General transfer comment: Pt again limited most-likely due to confusion, but despite needing repeated encouragement/cues she did rise to standing w/o direct assist  Ambulation/Gait Ambulation/Gait assistance: Min guard Gait Distance (Feet): 15 Feet Assistive device: Rolling walker (2 wheeled)       General Gait Details: Pt able to do some limited ambulation in room that was relatively saf apart from needing constant directional cuing due to confusion.  , she appears to have been able to do much more but was very limited due to mental status and showed little motivation to do more.  She repeats that she is tired and was quick to sit when close enough to the bed to do so.  Stairs            Wheelchair Mobility    Modified Rankin (Stroke Patients Only)       Balance Overall balance assessment: Needs assistance Sitting-balance support: No upper extremity supported Sitting balance-Leahy Scale: Fair Sitting balance - Comments: Pt was able to maintain sitting balance seemingly w/o issue, but was quick to lay back down on her side to rest after brief standing tasks.     Standing balance-Leahy Scale: Fair Standing balance comment: Pt was able to stand after trying to use commode and with light cuing was able to use both hands to pull up  underwear and pants w/o LOBs or overt unsteadiness                             Pertinent Vitals/Pain Pain Assessment: Faces Pain Score: 0-No pain Pain Location: does not indicate pain but clearly tired and frustrated    Home Living Family/patient expects to be discharged to:: Assisted living Living Arrangements:  [facility])             Home Equipment:  (pt indicates that she uses a walker at baseline, difficult to get much info out  of her)      Prior Function Level of Independence: Independent         Comments: Pt does have hx of dementia but per chart review she normally is very ambulatory and performs her own self care task independently. This session pt with increased confusion and unable to answer most of questions.     Hand Dominance        Extremity/Trunk Assessment   Upper Extremity Assessment Upper Extremity Assessment: Overall WFL for tasks assessed;Difficult to assess due to impaired cognition    Lower Extremity Assessment Lower Extremity Assessment: Overall WFL for tasks assessed;Difficult to assess due to impaired cognition       Communication   Communication: Expressive difficulties  Cognition Arousal/Alertness: Awake/alert Behavior During Therapy: Restless;Flat affect Overall Cognitive Status: Difficult to assess                                 General Comments: Pt with documented baseline dementia, however she was very confused and clearly frustrated today with limited meaningful interaction which is apparently not her normal      General Comments General comments (skin integrity, edema, etc.): Pt with confusion, c/o being tired, flat affect and (per prior reports) not herself    Exercises     Assessment/Plan    PT Assessment Patient needs continued PT services  PT Problem List Decreased strength;Decreased range of motion;Decreased activity tolerance;Decreased balance;Decreased mobility;Decreased coordination;Decreased cognition;Decreased knowledge of use of DME;Decreased safety awareness;Decreased knowledge of precautions       PT Treatment Interventions DME instruction;Gait training;Functional mobility training;Therapeutic activities;Therapeutic exercise;Balance training;Cognitive remediation;Neuromuscular re-education;Patient/family education    PT Goals (Current goals can be found in the Care Plan section)  Acute Rehab PT Goals Patient Stated Goal: none  stated PT Goal Formulation: Patient unable to participate in goal setting Time For Goal Achievement: 04/23/21 Potential to Achieve Goals: Fair    Frequency Min 2X/week   Barriers to discharge        Co-evaluation               AM-PAC PT "6 Clicks" Mobility  Outcome Measure Help needed turning from your back to your side while in a flat bed without using bedrails?: None Help needed moving from lying on your back to sitting on the side of a flat bed without using bedrails?: None Help needed moving to and from a bed to a chair (including a wheelchair)?: A Little Help needed standing up from a chair using your arms (e.g., wheelchair or bedside chair)?: A Little Help needed to walk in hospital room?: A Little Help needed climbing 3-5 steps with a railing? : A Lot 6 Click Score: 19    End of Session   Activity Tolerance: Patient limited by fatigue;Patient limited by lethargy Patient left: with bed alarm set;with call  bell/phone within reach Nurse Communication: Mobility status PT Visit Diagnosis: Difficulty in walking, not elsewhere classified (R26.2);Unsteadiness on feet (R26.81)    Time: [id number] PT Time Calculation (min) (ACUTE ONLY): 25 min   Charges:   PT Evaluation $PT Eval Low Complexity: 1 Low PT Treatments $Gait Training: 8-22 mins        Malachi Pro, DPT 04/09/2021, 4:21 PM

## 2021-04-09 NOTE — H&P (Signed)
History and Physical    [name] STM:[id number] DOB: [birth year] DOA: 04/08/2021  PCP: Pcp, No   Patient coming from: ALF  I have personally briefly reviewed patient's old medical records in [facility]  Chief Complaint: Weakness  HPI: Sara Ho is a 80 y.o. female with medical history significant for HTN not on medication, lumbar DDD, dementia, CKD stage IIIa, diagnosed with COVID on 7/13 after developing a fever and cough, since improving who was sent to the emergency room due to generalized weakness and poor oral intake that is persistent in spite of improving cough.  Most of the history is taken from patient's daughter due to her history of dementia.  She states at baseline patient is interactive and will walk around and go down to the dining room at the assisted living.  Over the past week her oral intake went from 2 meals per day to just occasional bites and over the past 3 days, patient has spent most of the day in bed which is not usual for her.  She has had no vomiting or diarrhea and no complaints of chest pain or abdominal pain.  Per daughter, she does not appear to have shortness of breath or any difficulty breathing.  Has had no cough in the past couple days.  ED course: On arrival, temperature 99, BP 249/208 with pulse 106 respirations 18 and O2 sat 92% on room air.  Blood work mostly unremarkable with normal WBC and procalcitonin less than 0.1.  Creatinine 1.05 which is at baseline.  Troponin 30/31.  EKG, personally viewed and interpreted: NSR at 84 with no acute ST-T wave changes  Imaging: CT head: No acute intracranial abnormality CTA chest PE protocol: No PE, moderate centrilobular emphysema, complete collapse of the left lower lobe with central airway impaction.  No central obstructing mass lesion.  Dilation of the aortic arch measuring 3.3 cm in greatest dimension with recommendation for annual CTA or MRA  Patient given a small IV fluid bolus.  Hospitalist  consulted for admission.  Review of Systems: Unreliable given history of dementia but patient denying all complaints   Past Medical History:  Diagnosis Date   Dementia (HCC)     Past Surgical History:  Procedure Laterality Date   ABDOMINAL HYSTERECTOMY     APPENDECTOMY       reports that she has never smoked. She has never used smokeless tobacco. She reports previous alcohol use. She reports previous drug use.  Allergies  Allergen Reactions   Hydrocodone Bit-[place] Mbr Nausea Only    With Liquid Preparation Only; Patient reportedly can take the pill form of Hydrocodone without any side effects     Family history: Unable to obtain due to history of dementia   Prior to Admission medications   Medication Sig Start Date End Date Taking? Authorizing Provider  acetaminophen (TYLENOL) 500 MG tablet Take 1,000 mg by mouth in the morning and at bedtime.   Yes [provider]  aspirin 81 MG EC tablet Take 81 mg by mouth daily.   Yes [provider]  Calcium Carb-Cholecalciferol 600-400 MG-UNIT TABS Take 1 tablet by mouth in the morning and at bedtime.   Yes [provider]  cyanocobalamin 1000 MCG tablet Take 1,000 mcg by mouth daily.   Yes [provider]  gabapentin (NEURONTIN) 600 MG tablet Take 600 mg by mouth in the morning and at bedtime. 07/12/16  Yes [provider]  ipratropium (ATROVENT) 0.03 % nasal spray Place 1 spray into  the nose 2 (two) times daily as needed. 03/16/17  Yes [provider]  oxybutynin (DITROPAN-XL) 10 MG 24 hr tablet Take 1 tablet by mouth daily. 03/22/17  Yes [provider]  simvastatin (ZOCOR) 40 MG tablet Take 1 tablet by mouth daily. 03/22/17  Yes [provider]    Physical Exam: Vitals:   04/08/21 1912 04/08/21 2151 04/08/21 2304 04/08/21 2330  BP:  (!) 150/104 (!) 185/89 (!) 182/99  Pulse:  64 70 67  Resp:  18 15 17   Temp:      TempSrc:      SpO2:  94% 91% 92%  Weight:  63.5 kg     Height: 5\' 6"  (1.676 m)        Vitals:   04/08/21 1912 04/08/21 2151 04/08/21 2304 04/08/21 2330  BP:  (!) 150/104 (!) 185/89 (!) 182/99  Pulse:  64 70 67  Resp:  18 15 17   Temp:      TempSrc:      SpO2:  94% 91% 92%  Weight: 63.5 kg     Height: 5\' 6"  (1.676 m)         Constitutional: Alert and oriented x 2 . Not in any apparent distress HEENT:      Head: Normocephalic and atraumatic.         Eyes: PERLA, EOMI, Conjunctivae are normal. Sclera is non-icteric.       Mouth/Throat: Mucous membranes are moist.       Neck: Supple with no signs of meningismus. Cardiovascular: Regular rate and rhythm. No murmurs, gallops, or rubs. 2+ symmetrical distal pulses are present . No JVD. No LE edema Respiratory: Respiratory effort normal .Lungs sounds clear bilaterally. No wheezes, crackles, or rhonchi.  Gastrointestinal: Soft, non tender, and non distended with positive bowel sounds.  Genitourinary: No CVA tenderness. Musculoskeletal: Nontender with normal range of motion in all extremities. No cyanosis, or erythema of extremities. Neurologic:  Face is symmetric. Moving all extremities. No gross focal neurologic deficits . Skin: Skin is warm, dry.  No rash or ulcers Psychiatric: Mood and affect are normal    Labs on Admission: I have personally reviewed following labs and imaging studies  CBC: Recent Labs  Lab 04/08/21 1916  WBC 7.8  HGB 13.4  HCT 41.3  MCV 101.5*  PLT 231   Basic Metabolic Panel: Recent Labs  Lab 04/08/21 1916  NA 145  K 3.7  CL 109  CO2 23  GLUCOSE 130*  BUN 42*  CREATININE 1.05*  CALCIUM 10.5*   GFR: Estimated Creatinine Clearance: 42 mL/min (A) (by C-G formula based on SCr of 1.05 mg/dL (H)). Liver Function Tests: Recent Labs  Lab 04/08/21 2136  AST 26  ALT 13  ALKPHOS 45  BILITOT 1.4*  PROT 8.0  ALBUMIN 4.1   No results for input(s): LIPASE, AMYLASE in the last 168 hours. No results for input(s): AMMONIA in the last 168  hours. Coagulation Profile: No results for input(s): INR, PROTIME in the last 168 hours. Cardiac Enzymes: No results for input(s): CKTOTAL, CKMB, CKMBINDEX, TROPONINI in the last 168 hours. BNP (last 3 results) No results for input(s): PROBNP in the last 8760 hours. HbA1C: No results for input(s): HGBA1C in the last 72 hours. CBG: No results for input(s): GLUCAP in the last 168 hours. Lipid Profile: No results for input(s): CHOL, HDL, LDLCALC, TRIG, CHOLHDL, LDLDIRECT in the last 72 hours. Thyroid Function Tests: No results for input(s): TSH, T4TOTAL, FREET4, T3FREE, THYROIDAB in the last 72  hours. Anemia Panel: No results for input(s): VITAMINB12, FOLATE, FERRITIN, TIBC, IRON, RETICCTPCT in the last 72 hours. Urine analysis: No results found for: COLORURINE, APPEARANCEUR, LABSPEC, PHURINE, GLUCOSEU, HGBUR, BILIRUBINUR, KETONESUR, PROTEINUR, UROBILINOGEN, NITRITE, LEUKOCYTESUR  Radiological Exams on Admission: DG Chest 2 View  Result Date: 04/08/2021 CLINICAL DATA:  Weakness EXAM: CHEST - 2 VIEW COMPARISON:  09/07/2020 FINDINGS: Airspace disease at the left lung base. Probable small left effusion. Normal cardiac size. Aortic atherosclerosis. No pneumothorax. IMPRESSION: Left lung base consolidation, probably representing pneumonia. Small left effusion. Recommend radiographic follow-up to resolution. Electronically Signed   By: Jasmine PangKim  Fujinaga M.D.   On: 04/08/2021 19:48   CT Head Wo Contrast  Result Date: 04/08/2021 CLINICAL DATA:  Altered mental status.  COVID positive last week. EXAM: CT HEAD WITHOUT CONTRAST TECHNIQUE: Contiguous axial images were obtained from the base of the skull through the vertex without intravenous contrast. COMPARISON:  Brain MRI 09/30/2020 FINDINGS: Brain: No evidence of acute infarction, hemorrhage, hydrocephalus, extra-axial collection or mass lesion/mass effect. Generalized atrophy is stable from prior exam. There is advanced periventricular and deep chronic  small vessel ischemia, also stable. 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 Vascular: No hyperdense vessel Skull: No fracture or focal lesion. Sinuses/Orbits: Occasional mucosal thickening of ethmoid air cells and left side of sphenoid sinus. No sinus fluid levels. No mastoid effusion. Bilateral cataract resection Other: None. IMPRESSION: 1. No acute intracranial abnormality. 2. Stable atrophy and chronic small vessel ischemia. Electronically Signed   By: Narda RutherfordMelanie  Sanford M.D.   On: 04/08/2021 22:41   CT Angio Chest PE W and/or Wo Contrast  Result Date: 04/08/2021 CLINICAL DATA:  High probability pulmonary embolism, COVID positive, malaise EXAM: CT ANGIOGRAPHY CHEST WITH CONTRAST TECHNIQUE: Multidetector CT imaging of the chest was performed using the standard protocol during bolus administration of intravenous contrast. Multiplanar CT image reconstructions and MIPs were obtained to evaluate the vascular anatomy. CONTRAST:  100mL OMNIPAQUE IOHEXOL 350 MG/ML SOLN COMPARISON:  None. FINDINGS: Cardiovascular: There is adequate opacification of the pulmonary arterial tree. No intraluminal filling defect identified to suggest acute pulmonary embolism. The central pulmonary arteries are of normal caliber. Minimal coronary artery calcification. Global cardiac size within normal limits. Moderate mixed atheromatous plaque within the thoracic aorta. The thoracic aorta is mildly dilated at the arch just beyond the takeoff of the left subclavian artery measuring 3.3 cm in diameter on coronal image # 46/7. Mediastinum/Nodes: Small hiatal hernia. The esophagus is unremarkable. Thyroid unremarkable. No pathologic thoracic adenopathy. Lungs/Pleura: Moderate apically predominant centrilobular emphysema. There is complete collapse of the left lower lobe. No focal pulmonary infiltrate or nodule identified. No pneumothorax or pleural effusion. No definite central obstructing mass identified.  There is impaction of the central airways of the left lower lobe. Upper Abdomen: No acute abnormality. Musculoskeletal: No acute bone abnormality. No lytic or blastic bone lesion identified. Review of the MIP images confirms the above findings. IMPRESSION: No acute pulmonary embolism. Moderate centrilobular emphysema. Complete collapse of the left lower lobe with central airway impaction. No central obstructing mass lesion. 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 Recommend annual imaging followup by CTA or MRA. This recommendation follows 2010 ACCF/AHA/AATS/ACR/ASA/SCA/SCAI/SIR/STS/SVM Guidelines for the Diagnosis and Management of Patients with Thoracic Aortic Disease. Circulation.2010; 121: W119-J478: E266-e369. Aortic aneurysm NOS (ICD10-I71.9) Mild coronary artery calcification. Aortic Atherosclerosis (ICD10-I70.0) and Emphysema (ICD10-J43.9). Aortic aneurysm NOS (ICD10-I71.9). Electronically Signed   By: Helyn NumbersAshesh  Parikh MD   On: 04/08/2021 22:46  Assessment/Plan 80 year old female with history of HTN not on medication, lumbar DDD, dementia, CKD stage IIIa, diagnosed with COVID on 7/13 presenting with generalized weakness, lethargy and poor oral intake that is persistent in spite of improving cough.     Generalized weakness   COVID-19 virus infection   Inadequate oral nutritional intake - Generalized weakness likely secondary to recent COVID infection on 7/13 - Supportive care - Vitamin C and zinc, antitussives - Supplemental O2 as needed to keep sats over 92% - Nutritionist, PT and TOC consults    Consolidation of left lower lobe of lung (HCC) - CTA chest showing complete collapse of left lower lobe without central obstructing mass lesion - Acuity of abnormal finding uncertain given no acute respiratory symptoms - Procalcitonin less than 0.1 so bacterial infection not suspected at this time - Consider pulmonology consult - Findings discussed with daughter    Hypertensive  urgency -Systolic BP over 725 in the emergency room - Daughter states patient is on no medication for BP - Hydralazine IV as needed SBP 178 and over and will incorporate oral as patient able to swallow    Osteoarthritis of lumbar spine - Continue Tylenol - We will hold off on Neurontin for now until more alert    Unspecified dementia without behavioral disturbance - Delirium precautions    Stage 3a chronic kidney disease (HCC) - Creatinine 1.05.  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  Also discussed CODE STATUS.  Patient is DNR disposition Plan: Back to previous home environment Consults called: none  Status:observation    Andris Baumann MD [facility]     04/09/2021, 12:11 AM

## 2021-04-09 NOTE — Plan of Care (Signed)

## 2021-04-09 NOTE — Progress Notes (Signed)
PT Cancellation Note  Patient Details Name: Neasia Fleeman MRN: [id number] DOB: 02-May-1941   Cancelled Treatment:    Reason Eval/Treat Not Completed: Other (comment) Chart reviewed, discussed case with nurse who reports that has been moving relatively well in the room and appropriate to work with PT once OT is done working with her (At time of discussion).  OT came out of the room after working with her and reports, suprisingly, that she was confused, impulsive and apparently becoming increasingly resistant to doing a lot with attempts at increased activity/OT assessment.  We all agreed that PT trying to work with her this AM is likely to be unfruitful; per pt status and availability may be able to try back this afternoon, or certainly attempt tomorrow if not able/appropriate.  Malachi Pro, DPT 04/09/2021, 11:03 AM

## 2021-04-10 DIAGNOSIS — F0390 Unspecified dementia without behavioral disturbance: Secondary | ICD-10-CM

## 2021-04-10 DIAGNOSIS — I10 Essential (primary) hypertension: Secondary | ICD-10-CM

## 2021-04-10 DIAGNOSIS — U071 COVID-19: Secondary | ICD-10-CM

## 2021-04-10 DIAGNOSIS — N1831 Chronic kidney disease, stage 3a: Secondary | ICD-10-CM

## 2021-04-10 DIAGNOSIS — E639 Nutritional deficiency, unspecified: Secondary | ICD-10-CM

## 2021-04-10 DIAGNOSIS — J181 Lobar pneumonia, unspecified organism: Secondary | ICD-10-CM | POA: Diagnosis not present

## 2021-04-10 DIAGNOSIS — I160 Hypertensive urgency: Secondary | ICD-10-CM

## 2021-04-10 DIAGNOSIS — R531 Weakness: Secondary | ICD-10-CM | POA: Diagnosis not present

## 2021-04-10 LAB — BASIC METABOLIC PANEL
Anion gap: 10 (ref 5–15)
BUN: 38 mg/dL — ABNORMAL HIGH (ref 8–23)
CO2: 24 mmol/L (ref 22–32)
Calcium: 9.8 mg/dL (ref 8.9–10.3)
Chloride: 107 mmol/L (ref 98–111)
Creatinine, Ser: 0.89 mg/dL (ref 0.44–1.00)
GFR, Estimated: >60 mL/min (ref >60)
Glucose, Bld: 120 mg/dL — ABNORMAL HIGH (ref 70–99)
Potassium: 3.7 mmol/L (ref 3.5–5.1)
Sodium: 141 mmol/L (ref 135–145)

## 2021-04-10 MED ORDER — AMLODIPINE BESYLATE 10 MG PO TABS
10.0000 mg | ORAL_TABLET | Freq: Every day | ORAL | Status: DC
  Administered 2021-04-10 – 2021-04-13 (×4): 10 mg via ORAL
  Filled 2021-04-10 (×4): qty 1

## 2021-04-10 NOTE — Progress Notes (Signed)
PROGRESS NOTE  Sara Ho  UKG:[id number] DOB: [birth year] DOA: 04/08/2021 PCP: Oneita Hurt, No   Brief Narrative: Sara Ho is a 80 y.o. female with a history of HTN not on medication, dementia, stage IIIa CKD, and covid-19 diagnosed 7/13 who presented to the ED with poor per oral intake and diffuse weakness despite improvement in cough. Afebrile, not hypoxic, but severely hypertensive at 249/208 in the ED. Blood work largely unremarkable including no leukocytosis, negative procalcitonin, and creatinine near baseline. Troponin 30 and 31 without ST changes on ECG or complaints of chest pain. Head CT negative for acute changes. CTA chest revealed emphysema, collapse of LLL with central airway impaction suggestive of pneumonia, but no PE. Augmentin was started, SLP evaluation requested per PCCM consultation. The patient remains without hypoxia.   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 Suspected sequelae of covid-19 infection. - Continue PT and OT efforts. May require higher level of care for short term rehabilitation.   Poor per oral intake: - Dietitian consulted.   Acute delirium on chronic dementia: - Delirium precautions - Minimize sedating medications - Minimize hospitalization duration as much as is reasonable. Does functionally require SNF per PT evaluation.   Covid-19 infection: Dx 7/13 and has had improvement in respiratory symptoms, but some FTT. - Continue isolation for 10 days as long as she stays afebrile. Would complete isolation 7/23. - No indication for steroids without hypoxia, outside scope  of anticipated benefit from antiviral.    HTN urgency: Had been taken off BP medications several months PTA.  - Started norvasc 5mg  with modest improvement, can increase to 10mg  today.  - Continue prn hydralazine and monitor closely.    Hypernatremia: Improved with po water and change to hypotonic saline.   LLL consolidation:  - PCCM recommends treatment as pneumonia, started augmentin. Will continue this and repeat CXR 4 weeks following to resolution.  - Pulmonary toilet   Troponin elevation: Very mild and asymptomatic. - No further inpatient work up is currently planned.   Lumbar DDD: - Continue tylenol prn. No current complaints.   Stage IIIa CKD: Slightly improving, though not technically an AKI. 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 - Repeat imaging in 1 year (July 2023). This was discussed with pt's daughter.   HLD: Decreased simvastatin dosing.  DVT prophylaxis: Lovenox Code Status: DNR Family Communication: None at bedside, spoke with daughter 7/21. Disposition Plan:   Status is: Inpatient  Remains inpatient appropriate because:Altered mental status and Unsafe d/c plan  Dispo:  Patient From: Assisted Living Facility  Planned Disposition: Skilled Nursing Facility  Medically stable for discharge: No  Consultants:  PCCM  Procedures:  None  Antimicrobials: Augmentin   Subjective: No complaints this morning. She is interactive with me much moreso than as reportedly in a waxing and waning fashion by other staff. No dyspnea, cough, chest pain or other pain. Knows we're in [place] and knows the hospital in [place] is [facility]. Doesn't know why she's here or the date.  Objective: Vitals:   04/09/21 2007 04/10/21 0618 04/10/21 0757 04/10/21 1157  BP: (!) 155/72 (!) 156/91 (!) 150/90 131/84  Pulse:  76 65 69 70  Resp: 14 20 18 16   Temp: 98.1 F (36.7 C) 98.2 F (36.8 C) 98.4 F (36.9 C) 98.7 F (37.1 C)  TempSrc:  Oral Oral Oral   SpO2: 90% 96% 96% 97%  Weight:      Height:        Intake/Output Summary (Last 24 hours) at 04/10/2021 1607 Last data filed at 04/10/2021 1300 Gross per 24 hour  Intake 1849.86 ml  Output --  Net 1849.86 ml   Filed Weights   04/08/21 1912  Weight: 63.5 kg    Gen: 80 y.o. female in no distress  Pulm: Non-labored breathing room air. Clear to auscultation bilaterally.  CV: Regular rate and rhythm. No murmur, rub, or gallop. No JVD, no pedal edema. GI: Abdomen soft, non-tender, non-distended, with normoactive bowel sounds. No organomegaly or masses felt. Ext: Warm, no deformities Skin: No rashes, lesions or ulcers on visualized skin Neuro: Alert and incompletely oriented. No focal neurological deficits. Psych: Judgement and insight appear impaired. Mood & affect appropriate.   Data Reviewed: I have personally reviewed following labs and imaging studies  CBC: Recent Labs  Lab 04/08/21 1916 04/09/21 0553  WBC 7.8 8.6  HGB 13.4 13.1  HCT 41.3 39.8  MCV 101.5* 100.8*  PLT 231 214   Basic Metabolic Panel: Recent Labs  Lab 04/08/21 1916 04/09/21 0553 04/10/21 0839  NA 145 147* 141  K 3.7 3.7 3.7  CL 109 111 107  CO2 23 26 24   GLUCOSE 130* 132* 120*  BUN 42* 40* 38*  CREATININE 1.05* 0.95 0.89  CALCIUM 10.5* 10.4* 9.8   GFR: Estimated Creatinine Clearance: 49.6 mL/min (by C-G formula based on SCr of 0.89 mg/dL). Liver Function Tests: Recent Labs  Lab 04/08/21 2136 04/09/21 0553  AST 26 21  ALT 13 12  ALKPHOS 45 42  BILITOT 1.4* 1.3*  PROT 8.0 7.7  ALBUMIN 4.1 3.9   No results for input(s): LIPASE, AMYLASE in the last 168 hours. No results for input(s): AMMONIA in the last 168 hours. Coagulation Profile: No results for input(s): INR, PROTIME in the last 168 hours. Cardiac Enzymes: No results for input(s): CKTOTAL, CKMB, CKMBINDEX, TROPONINI in the last 168 hours. BNP (last 3 results) No results for input(s): PROBNP in the last 8760 hours. HbA1C: No  results for input(s): HGBA1C in the last 72 hours. CBG: No results for input(s): GLUCAP in the last 168 hours. Lipid Profile: No results for input(s): CHOL, HDL, LDLCALC, TRIG, CHOLHDL, LDLDIRECT in the last 72 hours. Thyroid Function Tests: No results for input(s): TSH, T4TOTAL, FREET4, T3FREE, THYROIDAB in the last 72 hours. Anemia Panel: No results for input(s): VITAMINB12, FOLATE, FERRITIN, TIBC, IRON, RETICCTPCT in the last 72 hours. Urine analysis: No results found for: COLORURINE, APPEARANCEUR, LABSPEC, PHURINE, GLUCOSEU, HGBUR, BILIRUBINUR, KETONESUR, PROTEINUR, UROBILINOGEN, NITRITE, LEUKOCYTESUR No results found for this or any previous visit (from the past 240 hour(s)).    Radiology Studies: DG Chest 2 View  Result Date: 04/08/2021 CLINICAL DATA:  Weakness EXAM: CHEST - 2 VIEW COMPARISON:  09/07/2020 FINDINGS: Airspace disease at the left lung base. Probable small left effusion. Normal cardiac size. Aortic atherosclerosis. No pneumothorax. IMPRESSION: Left lung base consolidation, probably representing pneumonia. Small left effusion. Recommend radiographic follow-up to resolution. Electronically Signed   By: 04/10/2021 M.D.   On: 04/08/2021 19:48   CT Head Wo Contrast  Result Date: 04/08/2021 CLINICAL DATA:  Altered mental status.  COVID positive last week. EXAM: CT HEAD WITHOUT CONTRAST  TECHNIQUE: Contiguous axial images were obtained from the base of the skull through the vertex without intravenous contrast. COMPARISON:  Brain MRI 09/30/2020 FINDINGS: Brain: No evidence of acute infarction, hemorrhage, hydrocephalus, extra-axial collection or mass lesion/mass effect. Generalized atrophy is stable from prior exam. There is advanced periventricular and deep chronic small vessel ischemia, also stable. 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 Vascular: No hyperdense vessel Skull: No fracture or focal lesion. Sinuses/Orbits: Occasional mucosal  thickening of ethmoid air cells and left side of sphenoid sinus. No sinus fluid levels. No mastoid effusion. Bilateral cataract resection Other: None. IMPRESSION: 1. No acute intracranial abnormality. 2. Stable atrophy and chronic small vessel ischemia. Electronically Signed   By: Narda Rutherford M.D.   On: 04/08/2021 22:41   CT Angio Chest PE W and/or Wo Contrast  Result Date: 04/08/2021 CLINICAL DATA:  High probability pulmonary embolism, COVID positive, malaise EXAM: CT ANGIOGRAPHY CHEST WITH CONTRAST TECHNIQUE: Multidetector CT imaging of the chest was performed using the standard protocol during bolus administration of intravenous contrast. Multiplanar CT image reconstructions and MIPs were obtained to evaluate the vascular anatomy. CONTRAST:  OMNIPAQUE IOHEXOL 350 MG/ML SOLN COMPARISON:  None. FINDINGS: Cardiovascular: There is adequate opacification of the pulmonary arterial tree. No intraluminal filling defect identified to suggest acute pulmonary embolism. The central pulmonary arteries are of normal caliber. Minimal coronary artery calcification. Global cardiac size within normal limits. Moderate mixed atheromatous plaque within the thoracic aorta. The thoracic aorta is mildly dilated at the arch just beyond the takeoff of the left subclavian artery measuring 3.3 cm in diameter on coronal image # 46/7. Mediastinum/Nodes: Small hiatal hernia. The esophagus is unremarkable. Thyroid unremarkable. No pathologic thoracic adenopathy. Lungs/Pleura: Moderate apically predominant centrilobular emphysema. There is complete collapse of the left lower lobe. No focal pulmonary infiltrate or nodule identified. No pneumothorax or pleural effusion. No definite central obstructing mass identified. There is impaction of the central airways of the left lower lobe. Upper Abdomen: No acute abnormality. Musculoskeletal: No acute bone abnormality. No lytic or blastic bone lesion identified. Review of the MIP images  confirms the above findings. IMPRESSION: No acute pulmonary embolism. Moderate centrilobular emphysema. Complete collapse of the left lower lobe with central airway impaction. No central obstructing mass lesion. 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 Recommend annual imaging followup by CTA or MRA. This recommendation follows 2010 ACCF/AHA/AATS/ACR/ASA/SCA/SCAI/SIR/STS/SVM Guidelines for the Diagnosis and Management of Patients with Thoracic Aortic Disease. Circulation.2010; 121: X211-H417. Aortic aneurysm NOS (ICD10-I71.9) Mild coronary artery calcification. Aortic Atherosclerosis (ICD10-I70.0) and Emphysema (ICD10-J43.9). Aortic aneurysm NOS (ICD10-I71.9). Electronically Signed   By: Helyn Numbers MD   On: 04/08/2021 22:46    Scheduled Meds:  albuterol  2 puff Inhalation BID   amLODipine  10 mg Oral Daily   amoxicillin-clavulanate  1 tablet Oral Q12H   vitamin C  500 mg Oral Daily   aspirin EC  81 mg Oral Daily   enoxaparin (LOVENOX) injection  40 mg Subcutaneous Q24H   oxybutynin  10 mg Oral Daily   simvastatin  10 mg Oral Daily   cyanocobalamin  1,000 mcg Oral Daily   zinc sulfate  220 mg Oral Daily   Continuous Infusions:  sodium chloride Stopped (04/10/21 1252)     LOS: 2 days   Time spent: 25 minutes.  Tyrone Nine, MD [facility] [email or web address] 04/10/2021, 4:07 PM

## 2021-04-10 NOTE — Progress Notes (Signed)
SLP Cancellation Note  Patient Details Name: Sara Ho MRN: [id number] DOB: 1941-05-20   Cancelled treatment:       Reason Eval/Treat Not Completed: SLP screened, no needs identified, will sign off  Pt willing to consume several bites and sips of food. No obvious s/s of oropharyngeal dysphagia or aspiration noted. Chest x-ray reviewed with decreased probability of aspiration pneumonia.   Please reconsult if pt condition changes.   [name] B. Dreama Saa M.S., CCC-SLP, [facility] Speech-Language Pathologist Rehabilitation Services Office [phone]    Reuel Derby 04/10/2021, 10:48 AM

## 2021-04-10 NOTE — Plan of Care (Signed)
Tele sitter remains in place and is effective.  Pt has denied any pain or discomfort this shift.  Airborne and Contact precautions maintained. Hilton Sinclair BSN RN CMSRN   Problem: Safety: Goal: Ability to remain free from injury will improve Outcome: Progressing

## 2021-04-10 NOTE — Progress Notes (Signed)
Physical Therapy Treatment Patient Details Name: Sara Ho MRN: [id number] DOB: 04-11-1941 Today's Date: 04/10/2021    History of Present Illness 80 y.o. female with medical history significant for HTN not on medication, lumbar DDD, dementia, CKD stage IIIa, diagnosed with COVID on 7/13 after developing a fever and cough, since improving who was sent to the emergency room due to generalized weakness and poor oral intake that is persistent in spite of improving cough.  Per notes (via daughter) at baseline patient is interactive and will walk around and go down to the dining room at the assisted living.  Over the past week her oral intake went from 2 meals per day to just occasional bites and over the past 3 days, patient has spent most of the day in bed which is not usual for her.    PT Comments    Pt remains very lethargic yet willing to attempt PT.  Supine to sit with HOB raised and supervision. Sitting EOB x 1-2 minutes with B UE support, unable to maintain due to fatigue causing pt to lie back down. Pt declined any further activity. She was positioned to comfort call bell in reach and family at bedside.   Follow Up Recommendations  Supervision/Assistance - 24 hour;SNF     Equipment Recommendations  None recommended by PT    Recommendations for Other Services       Precautions / Restrictions Precautions Precautions: Fall Restrictions Weight Bearing Restrictions: No    Mobility  Bed Mobility Overal bed mobility: Needs Assistance Bed Mobility: Supine to Sit;Sit to Supine     Supine to sit: Supervision Sit to supine: Supervision   General bed mobility comments: mod multimodal cuing and increased time secondary to confusion but able to phyiscally perform    Transfers                 General transfer comment: Pt declined sit<>stand  Ambulation/Gait             General Gait Details: Pt refused   Stairs             Wheelchair Mobility     Modified Rankin (Stroke Patients Only)       Balance Overall balance assessment: Needs assistance Sitting-balance support: Bilateral upper extremity supported Sitting balance-Leahy Scale: Fair Sitting balance - Comments: Increased fatigue this day with forward flexed posture in sitting                                    Cognition Arousal/Alertness: Lethargic Behavior During Therapy: Flat affect Overall Cognitive Status: Difficult to assess                                 General Comments: Pt with documented baseline dementia, however she was very confused and clearly frustrated today with limited meaningful interaction which is apparently not her normal      Exercises      General Comments General comments (skin integrity, edema, etc.):  (Pt only tolerated 1-2 minutes sitting at edge of bed prior to fatiguing and lying back down)      Pertinent Vitals/Pain Pain Assessment: No/denies pain    Home Living                      Prior Function  PT Goals (current goals can now be found in the care plan section) Acute Rehab PT Goals Patient Stated Goal: none stated    Frequency    Min 2X/week      PT Plan Current plan remains appropriate    Co-evaluation              AM-PAC PT "6 Clicks" Mobility   Outcome Measure  Help needed turning from your back to your side while in a flat bed without using bedrails?: None Help needed moving from lying on your back to sitting on the side of a flat bed without using bedrails?: None Help needed moving to and from a bed to a chair (including a wheelchair)?: A Little Help needed standing up from a chair using your arms (e.g., wheelchair or bedside chair)?: A Little Help needed to walk in hospital room?: A Little Help needed climbing 3-5 steps with a railing? : A Lot 6 Click Score: 19    End of Session   Activity Tolerance: Patient limited by fatigue;Patient limited by  lethargy Patient left: with bed alarm set;with call bell/phone within reach Nurse Communication: Mobility status PT Visit Diagnosis: Difficulty in walking, not elsewhere classified (R26.2);Unsteadiness on feet (R26.81)     Time: 2458-0998 PT Time Calculation (min) (ACUTE ONLY): 20 min  Charges:  $Therapeutic Activity: 8-22 mins                     Zadie Cleverly, PTA    Jannet Askew 04/10/2021, 5:45 PM

## 2021-04-11 DIAGNOSIS — I10 Essential (primary) hypertension: Secondary | ICD-10-CM | POA: Diagnosis not present

## 2021-04-11 DIAGNOSIS — J181 Lobar pneumonia, unspecified organism: Secondary | ICD-10-CM | POA: Diagnosis not present

## 2021-04-11 DIAGNOSIS — R531 Weakness: Secondary | ICD-10-CM | POA: Diagnosis not present

## 2021-04-11 DIAGNOSIS — U071 COVID-19: Secondary | ICD-10-CM | POA: Diagnosis not present

## 2021-04-11 NOTE — Progress Notes (Signed)
PROGRESS NOTE  Sara Ho  QMG:[id number] DOB: [birth year] DOA: 04/08/2021 PCP: Oneita Hurt, No   Brief Narrative: Sara Ho is a 80 y.o. female with a history of HTN not on medication, dementia, stage IIIa CKD, and covid-19 diagnosed 7/13 who presented to the ED with poor per oral intake and diffuse weakness despite improvement in cough. Afebrile, not hypoxic, but severely hypertensive at 249/208 in the ED. Blood work largely unremarkable including no leukocytosis, negative procalcitonin, and creatinine near baseline. Troponin 30 and 31 without ST changes on ECG or complaints of chest pain. Head CT negative for acute changes. CTA chest revealed emphysema, collapse of LLL with central airway impaction suggestive of pneumonia, but no PE. Augmentin was started, SLP evaluation requested per PCCM consultation. The patient remains without hypoxia.   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 Suspected sequelae of covid-19 infection. - Continue PT and OT efforts, currently requires SNF level of care. TOC consulted.   Poor per oral intake: - Dietitian consulted.   Acute delirium on chronic dementia: - Delirium precautions - Minimize sedating medications - Minimize hospitalization duration as much as is reasonable. Does functionally require SNF per PT evaluation.   Covid-19 infection: Dx 7/13 and has had improvement in respiratory symptoms, but some FTT. - Continue isolation for 10 days as long as she stays afebrile, can come off isolation at midnight tonight. - No indication for steroids without hypoxia, outside scope  of anticipated benefit from antiviral.    HTN urgency: Had been taken off BP medications several months PTA.  - Started norvasc, increased to 10mg   - Continue prn hydralazine and monitor closely.    Hypernatremia: Improved with po water and change to hypotonic saline.   LLL consolidation:  - PCCM recommends treatment as pneumonia, started augmentin. Will continue this and repeat CXR 4 weeks following to resolution.  - Pulmonary toilet   Troponin elevation: Very mild and asymptomatic. - No further inpatient work up is currently planned.   Lumbar DDD: - Continue tylenol prn. No current complaints.   Stage IIIa CKD: Slightly improving, though not technically an AKI. 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 - Repeat imaging in 1 year (July 2023). This was discussed with pt's daughter.   HLD: Decreased simvastatin dosing.  DVT prophylaxis: Lovenox Code Status: DNR Family Communication: None at bedside, spoke with daughter 7/21. Disposition Plan:   Status is: Inpatient  Remains inpatient appropriate because:Altered mental status and Unsafe d/c plan  Dispo:  Patient From: Assisted Living Facility  Planned Disposition: Skilled Nursing Facility  Medically stable for discharge: No  Consultants:  PCCM  Procedures:  None  Antimicrobials: Augmentin   Subjective: Denies pain or trouble breathing. No leg swelling and reluctantly agrees to let me check her legs to confirm. No lethargy on any of my encounters though that is described in PT note yesterday.   Objective: Vitals:   04/10/21 1616 04/10/21 2116 04/11/21 0411 04/11/21 0734  BP: 131/88 (!) 153/85 127/68 (!) 144/83  Pulse: 77 76 73 76  Resp: 16 20 17 18   Temp: 97.8 F (36.6 C) 98.8 F (37.1 C) 98.4 F (  36.9 C) 98.7 F (37.1 C)  TempSrc: Oral Oral  Oral  SpO2: 93% 94% 92% 93%  Weight:      Height:        Intake/Output Summary (Last 24 hours) at 04/11/2021 0845 Last data filed at 04/11/2021  0442 Gross per 24 hour  Intake 2088.47 ml  Output 600 ml  Net 1488.47 ml   Filed Weights   04/08/21 1912  Weight: 63.5 kg   Gen: 80 y.o. female in no distress Pulm: Nonlabored breathing room air. Clear. CV: Regular rate and rhythm. No murmur, rub, or gallop. No JVD, no dependent edema. GI: Abdomen soft, non-tender, non-distended, with normoactive bowel sounds.  Ext: Warm, no deformities Skin: No rashes, lesions or ulcers on visualized skin. Neuro: Alert and disoriented without focal neurological deficits. Psych: Judgement and insight appear impaired currently calm but not cooperative.    Data Reviewed: I have personally reviewed following labs and imaging studies  CBC: Recent Labs  Lab 04/08/21 1916 04/09/21 0553  WBC 7.8 8.6  HGB 13.4 13.1  HCT 41.3 39.8  MCV 101.5* 100.8*  PLT 231 214   Basic Metabolic Panel: Recent Labs  Lab 04/08/21 1916 04/09/21 0553 04/10/21 0839  NA 145 147* 141  K 3.7 3.7 3.7  CL 109 111 107  CO2 23 26 24   GLUCOSE 130* 132* 120*  BUN 42* 40* 38*  CREATININE 1.05* 0.95 0.89  CALCIUM 10.5* 10.4* 9.8   GFR: Estimated Creatinine Clearance: 49.6 mL/min (by C-G formula based on SCr of 0.89 mg/dL). Liver Function Tests: Recent Labs  Lab 04/08/21 2136 04/09/21 0553  AST 26 21  ALT 13 12  ALKPHOS 45 42  BILITOT 1.4* 1.3*  PROT 8.0 7.7  ALBUMIN 4.1 3.9     Scheduled Meds:  albuterol  2 puff Inhalation BID   amLODipine  10 mg Oral Daily   amoxicillin-clavulanate  1 tablet Oral Q12H   vitamin C  500 mg Oral Daily   aspirin EC  81 mg Oral Daily   enoxaparin (LOVENOX) injection  40 mg Subcutaneous Q24H   oxybutynin  10 mg Oral Daily   simvastatin  10 mg Oral Daily   cyanocobalamin  1,000 mcg Oral Daily   zinc sulfate  220 mg Oral Daily   Continuous Infusions:  sodium chloride 75 mL/hr at 04/11/21 0548     LOS: 3 days   Time spent: 25 minutes.  04/13/21, MD [facility] [email or web address] 04/11/2021, 8:45 AM

## 2021-04-11 NOTE — TOC Progression Note (Signed)
Transition of Care [facility]) - Progression Note    Patient Details  Name: Sara Ho MRN: [id number] Date of Birth: 03-04-1941  [facility] CM/SW Contact  Maree Krabbe, LCSW Phone Number: 04/11/2021, 2:36 PM  Clinical Narrative:   Referral faxed to [facility] with [facility]. She is reviewing the referral for pt to return to [facility] response.         Expected Discharge Plan and Services                                                 Social Determinants of Health (SDOH) Interventions    Readmission Risk Interventions No flowsheet data found.

## 2021-04-12 DIAGNOSIS — I10 Essential (primary) hypertension: Secondary | ICD-10-CM | POA: Diagnosis not present

## 2021-04-12 DIAGNOSIS — J181 Lobar pneumonia, unspecified organism: Secondary | ICD-10-CM | POA: Diagnosis not present

## 2021-04-12 DIAGNOSIS — R531 Weakness: Secondary | ICD-10-CM | POA: Diagnosis not present

## 2021-04-12 DIAGNOSIS — U071 COVID-19: Secondary | ICD-10-CM | POA: Diagnosis not present

## 2021-04-12 MED ORDER — SODIUM CHLORIDE 0.45 % IV SOLN: INTRAVENOUS | Status: DC

## 2021-04-12 MED ORDER — NAPHAZOLINE-PHENIRAMINE 0.025-0.3 % OP SOLN
1.0000 [drp] | Freq: Four times a day (QID) | OPHTHALMIC | Status: DC | PRN
  Filled 2021-04-12 (×2): qty 5

## 2021-04-12 NOTE — Progress Notes (Addendum)
PROGRESS NOTE  Sara Ho  IWL:[id number] DOB: 14-May-1941 DOA: 04/08/2021 PCP: Oneita Hurt, No   Brief Narrative: Sara Ho is a 80 y.o. female with a history of HTN not on medication, dementia, stage IIIa CKD, and covid-19 diagnosed 7/13 who presented to the ED with poor per oral intake and diffuse weakness despite improvement in cough. Afebrile, not hypoxic, but severely hypertensive at 249/208 in the ED. Blood work largely unremarkable including no leukocytosis, negative procalcitonin, and creatinine near baseline. Troponin 30 and 31 without ST changes on ECG or complaints of chest pain. Head CT negative for acute changes. CTA chest revealed emphysema, collapse of LLL with central airway impaction suggestive of pneumonia, but no PE. Augmentin was started, SLP evaluation requested per PCCM consultation. The patient remains without hypoxia.   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 Suspected sequelae of covid-19 infection. - Continue PT and OT efforts, currently requires SNF level of care. TOC consulted.   Poor per oral intake: - Dietitian consulted.   Acute delirium on chronic dementia: - Delirium precautions - Minimize sedating medications. Continue telesitter, has been moved closer to the nurses station. - Minimize hospitalization duration as much as is reasonable. Does functionally require SNF per PT evaluation so will pursue that disposition.   Covid-19 infection: Dx 7/13 and has had improvement in respiratory symptoms, but some FTT. - No longer requires isolation. - No indication for steroids without  hypoxia, outside scope of anticipated benefit from antiviral.    HTN urgency: Had been taken off BP medications several months PTA.  - Started norvasc, increased to 10mg  with improvement.  - Continue prn hydralazine and monitor closely.    Hypernatremia: Improved with po water and change to hypotonic saline.  - Stopped IVF, but po intake has been minimal for 2 days. Will restart maintenance IVF and recheck BMP in AM  - Note failure to thrive PTA and during this hospitalization, very limited interaction and po intake. If this continues, we've discussed that I would recommend palliative care referral.  LLL consolidation:  - PCCM recommends treatment as pneumonia, started augmentin. Will continue this and repeat CXR 4 weeks following to resolution.  - Pulmonary toilet   Troponin elevation: Very mild and asymptomatic. - No further inpatient work up is currently planned.   Lumbar DDD: - Continue tylenol prn. No current complaints.   Stage IIIa CKD: Slightly improving, though not technically an AKI. 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 - Repeat imaging in 1 year (July 2023). This was discussed with pt's daughter.   HLD: Decreased simvastatin dosing.  DVT prophylaxis: Lovenox Code Status: DNR Family Communication: None at bedside, spoke with daughter by phone Disposition Plan:   Status is: Inpatient  Remains inpatient appropriate because:Altered mental status and Unsafe d/c plan  Dispo:  Patient From: Assisted Living Facility  Planned Disposition: Skilled Nursing Facility  Medically stable for discharge: No  Consultants:  PCCM  Procedures:  None  Antimicrobials: Augmentin   Subjective: Not eating any breakfast. Denies any complaints, but wants to be left alone.   Objective: Vitals:   04/11/21 1512 04/11/21 1952 04/12/21 0420 04/12/21  0734  BP: 133/79 (!) 153/80 (!) 145/81 (!) 145/78  Pulse: 84 80 72 77  Resp: 18 14 17 18   Temp: 98.7 F  (37.1 C) 98.3 F (36.8 C) 98.5 F (36.9 C) 98.6 F (37 C)  TempSrc:   Oral Oral  SpO2: 98% 98% 97% 97%  Weight:      Height:        Intake/Output Summary (Last 24 hours) at 04/12/2021 1114 Last data filed at 04/11/2021 2200 Gross per 24 hour  Intake 340 ml  Output --  Net 340 ml   Filed Weights   04/08/21 1912  Weight: 63.5 kg   Gen: 80 y.o. female in no distress Pulm: Nonlabored breathing room air, clear. CV: Regular without JVD or edema. GI: Abdomen soft, non-tender, non-distended, with normoactive bowel sounds.  Ext: Warm, dry, no deformities Skin: No new rashes, lesions or ulcers on visualized skin. Neuro: Alert and disoriented, not cooperative with exam.  Psych: UTD.  Data Reviewed: I have personally reviewed following labs and imaging studies  CBC: Recent Labs  Lab 04/08/21 1916 04/09/21 0553  WBC 7.8 8.6  HGB 13.4 13.1  HCT 41.3 39.8  MCV 101.5* 100.8*  PLT 231 214   Basic Metabolic Panel: Recent Labs  Lab 04/08/21 1916 04/09/21 0553 04/10/21 0839  NA 145 147* 141  K 3.7 3.7 3.7  CL 109 111 107  CO2 23 26 24   GLUCOSE 130* 132* 120*  BUN 42* 40* 38*  CREATININE 1.05* 0.95 0.89  CALCIUM 10.5* 10.4* 9.8   GFR: Estimated Creatinine Clearance: 49.6 mL/min (by C-G formula based on SCr of 0.89 mg/dL). Liver Function Tests: Recent Labs  Lab 04/08/21 2136 04/09/21 0553  AST 26 21  ALT 13 12  ALKPHOS 45 42  BILITOT 1.4* 1.3*  PROT 8.0 7.7  ALBUMIN 4.1 3.9     Scheduled Meds:  albuterol  2 puff Inhalation BID   amLODipine  10 mg Oral Daily   amoxicillin-clavulanate  1 tablet Oral Q12H   vitamin C  500 mg Oral Daily   aspirin EC  81 mg Oral Daily   enoxaparin (LOVENOX) injection  40 mg Subcutaneous Q24H   oxybutynin  10 mg Oral Daily   simvastatin  10 mg Oral Daily   cyanocobalamin  1,000 mcg Oral Daily   zinc sulfate  220 mg Oral Daily     LOS: 4 days   Time spent: 25 minutes.  2137, MD [facility] [email or web address] 04/12/2021, 11:14 AM

## 2021-04-13 DIAGNOSIS — R531 Weakness: Secondary | ICD-10-CM | POA: Diagnosis not present

## 2021-04-13 LAB — BASIC METABOLIC PANEL
Anion gap: 8 (ref 5–15)
BUN: 21 mg/dL (ref 8–23)
CO2: 22 mmol/L (ref 22–32)
Calcium: 8.8 mg/dL — ABNORMAL LOW (ref 8.9–10.3)
Chloride: 106 mmol/L (ref 98–111)
Creatinine, Ser: 0.9 mg/dL (ref 0.44–1.00)
GFR, Estimated: >60 mL/min (ref >60)
Glucose, Bld: 97 mg/dL (ref 70–99)
Potassium: 2.9 mmol/L — ABNORMAL LOW (ref 3.5–5.1)
Sodium: 136 mmol/L (ref 135–145)

## 2021-04-13 MED ORDER — POTASSIUM CHLORIDE CRYS ER 20 MEQ PO TBCR
40.0000 meq | EXTENDED_RELEASE_TABLET | Freq: Once | ORAL | Status: AC
  Administered 2021-04-13: 40 meq via ORAL
  Filled 2021-04-13: qty 2

## 2021-04-13 MED ORDER — AMOXICILLIN-POT CLAVULANATE 875-125 MG PO TABS: 1.0000 | ORAL_TABLET | Freq: Two times a day (BID) | ORAL | 0 refills | Status: AC

## 2021-04-13 MED ORDER — ALBUTEROL SULFATE HFA 108 (90 BASE) MCG/ACT IN AERS: 2.0000 | INHALATION_SPRAY | Freq: Two times a day (BID) | RESPIRATORY_TRACT | 0 refills | Status: AC

## 2021-04-13 MED ORDER — AMLODIPINE BESYLATE 10 MG PO TABS: 10.0000 mg | ORAL_TABLET | Freq: Every day | ORAL | 0 refills | Status: AC

## 2021-04-13 MED ORDER — GUAIFENESIN-DM 100-10 MG/5ML PO SYRP: 10.0000 mL | ORAL_SOLUTION | ORAL | 0 refills | Status: AC | PRN

## 2021-04-13 MED ORDER — SIMVASTATIN 10 MG PO TABS: 10.0000 mg | ORAL_TABLET | Freq: Every day | ORAL | 0 refills | Status: AC

## 2021-04-13 NOTE — Care Management Important Message (Signed)
Important Message  Patient Details  Name: Sara Ho MRN: [id number] Date of Birth: Dec 17, 1940   Medicare Important Message Given:  Yes     Johnell Comings 04/13/2021, 2:38 PM

## 2021-04-13 NOTE — Plan of Care (Signed)

## 2021-04-13 NOTE — Discharge Summary (Signed)
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 female with a history of HTN not on medication, dementia, stage IIIa CKD, and covid-19 diagnosed 7/13. Patient was brought to the ED on 7/20 from ALF for progressively worsening oral intake, generalized weakness.  In the ED, she was afebrile, not hypoxic.  However her blood pressure was significantly elevated to 249/208.  Blood work largely unremarkable including no leukocytosis, negative procalcitonin, and creatinine near baseline.  Troponin slightly elevated to 30 and 31 ECG without ST-T wave changes.  Head CT negative for acute changes. CTA chest showed emphysema, collapse of LLL with central airway impaction suggestive of pneumonia, but no PE.  Admitted to hospitalist service for further evaluation management. PCCM was consulted  Subjective:  Seen and examined this morning.  Pleasant elderly Caucasian female.  Not in distress  Hospital Course:    Covid-19 infection:  -Dx 7/13 and has had improvement in respiratory symptoms, but some FTT. -No longer requires isolation. -No indication for steroids without hypoxia, outside scope of anticipated benefit from antiviral.   Left lower lobe pneumonia -Related to COVID-19 versus aspiration  pneumonia. -PCCM was consulted.  Bronchoscopy was not required. -Recommended 7 days of Augmentin (7/21-7/27) -Speech therapy evaluation was obtained.  Regular diet with thin liquid allowed. -Repeat chest x-ray in 4 weeks. Recent Labs  Lab 04/08/21 1916 04/08/21 2136 04/09/21 0553  WBC 7.8  --  8.6  PROCALCITON  --  <0.10 <0.10   Acute delirium on chronic dementia: - Delirium precautions - Minimize sedating medications.  - Minimize hospitalization duration as much as is reasonable.   Generalized weakness -Negative neuroimaging.  -Suspected sequelae of covid-19 infection. -PT eval obtained.  Back to ALF today.   Poor per oral intake -Dietitian consult appreciated.  Encourage oral intake.  Hypertensive urgency -Blood pressure was elevated to over 200 on presentation.  Apparently she was taken off blood pressure medicines several months ago. -She is currently on Norvasc 10 mg daily with controlled blood pressure.   Hypernatremia -Sodium level elevated because of poor oral hydration.  With hypotonic saline, sodium level is back to normal now. Recent Labs  Lab 04/08/21 1916 04/09/21 0553 04/10/21 0839 04/13/21 0514  NA 145 147* 141 136   Lumbar DDD: - Continue tylenol prn. No current complaints.   Stage IIIa CKD -Stable creatinine. 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 - Repeat imaging in 1 year (July 2023). This was discussed with pt's daughter.   HLD -Continue statin at lower dose.   Allergies as of 04/13/2021       Reactions   Hydrocodone Bit-homatrop Mbr Nausea Only   With  Liquid Preparation Only; Patient reportedly can take the pill form of Hydrocodone without any side effects        Medication List     TAKE these medications    acetaminophen 500 MG tablet Commonly known as: TYLENOL Take 1,000  mg by mouth in the morning and at bedtime.   albuterol 108 (90 Base) MCG/ACT inhaler Commonly known as: VENTOLIN HFA Inhale 2 puffs into the lungs 2 (two) times daily.   amLODipine 10 MG tablet Commonly known as: NORVASC Take 1 tablet (10 mg total) by mouth daily. Start taking on: April 14, 2021   amoxicillin-clavulanate 875-125 MG tablet Commonly known as: AUGMENTIN Take 1 tablet by mouth every 12 (twelve) hours for 3 days.   aspirin 81 MG EC tablet Take 81 mg by mouth daily.   Calcium Carb-Cholecalciferol 600-400 MG-UNIT Tabs Take 1 tablet by mouth in the morning and at bedtime.   cyanocobalamin 1000 MCG tablet Take 1,000 mcg by mouth daily.   gabapentin 600 MG tablet Commonly known as: NEURONTIN Take 600 mg by mouth in the morning and at bedtime.   guaiFENesin-dextromethorphan 100-10 MG/5ML syrup Commonly known as: ROBITUSSIN DM Take 10 mLs by mouth every 4 (four) hours as needed for cough.   ipratropium 0.03 % nasal spray Commonly known as: ATROVENT Place 1 spray into the nose 2 (two) times daily as needed.   oxybutynin 10 MG 24 hr tablet Commonly known as: DITROPAN-XL Take 1 tablet by mouth daily.   simvastatin 10 MG tablet Commonly known as: ZOCOR Take 1 tablet (10 mg total) by mouth daily. Start taking on: April 14, 2021 What changed:  medication strength how much to take        Discharge Instructions:  Follow with Primary MD Pcp, No in 7 days   Get CBC/BMP checked in next visit within 1 week by PCP or SNF MD ( we routinely change or add medications that can affect your baseline labs and fluid status, therefore we recommend that you get the mentioned basic workup next visit with your PCP, your PCP may decide not to get them or add new tests based on their clinical decision)  On your next visit with your PCP, please Get Medicines reviewed and adjusted.  Please request your PCP  to go over all Hospital Tests and Procedure/Radiological results at the  follow up, please get all Hospital records sent to your Prim MD by signing hospital release before you go home.  Activity: As tolerated with Full fall precautions use walker/cane & assistance as needed  For Heart failure patients - Check your Weight same time everyday, if you gain over 2 pounds, or you develop in leg swelling, experience more shortness of breath or chest pain, call your Primary MD immediately. Follow Cardiac Low Salt Diet and 1.5 lit/day fluid restriction.  If you have smoked or chewed Tobacco in the last 2 yrs please stop smoking, stop any regular Alcohol  and or any Recreational drug use.  If you experience worsening of your admission symptoms, develop shortness of breath, life threatening emergency, suicidal or homicidal thoughts you must seek medical attention immediately by calling 911 or calling your MD immediately  if symptoms less severe.  You Must read complete instructions/literature along with all the possible adverse reactions/side effects for all the Medicines you take and that have been prescribed to you. Take any new Medicines after you have completely understood and accpet all the possible adverse reactions/side effects.   Do not drive, operate  heavy machinery, perform activities at heights, swimming or participation in water activities or provide baby sitting services if your were admitted for syncope or siezures until you have seen by Primary MD or a Neurologist and advised to do so again.  Do not drive when taking Pain medications.  Do not take more than prescribed Pain, Sleep and Anxiety Medications  Wear Seat belts while driving.   Please note You were cared for by a hospitalist during your hospital stay. If you have any questions about your discharge medications or the care you received while you were in the hospital after you are discharged, you can call the unit and asked to speak with the hospitalist on call if the hospitalist that took care of you is not  available. Once you are discharged, your primary care physician will handle any further medical issues. Please note that NO REFILLS for any discharge medications will be authorized once you are discharged, as it is imperative that you return to your primary care physician (or establish a relationship with a primary care physician if you do not have one) for your aftercare needs so that they can reassess your need for medications and monitor your lab values.    Follow ups:   Discharge Instructions     Diet general   Complete by: As directed    Increase activity slowly   Complete by: As directed        Follow-up Information     [facility] Follow up.   Contact information: [address] [place] [address] [phone]                Wound care:     Discharge Exam:   Vitals:   04/12/21 1524 04/12/21 1945 04/13/21 0401 04/13/21 0840  BP: (!) 125/108 (!) 143/80 129/75 130/78  Pulse: 90 86 86 79  Resp: Temp: 98.7 F (37.1 C) 98.1 F (36.7 C) 98.6 F (37 C) 98.1 F (36.7 C)  TempSrc:   Oral Oral  SpO2: 96% 95% 95% 94%  Weight:      Height:        Body mass index is 22.6 kg/m.  General exam: Pleasant, elderly Caucasian female.  Not in distress Skin: No rashes, lesions or ulcers. HEENT: Atraumatic, normocephalic, no obvious bleeding Lungs: Clear to auscultation bilaterally CVS: Regular rate and rhythm, no murmur GI/Abd soft, nontender, nondistended, bowel sound present CNS: Alert, awake, oriented to place and person, Psychiatry: Depressed look Extremities: No pedal edema, no calf tenderness  Time coordinating discharge: 35 minutes   The results of significant diagnostics from this hospitalization (including imaging, microbiology, ancillary and laboratory) are listed below for reference.    Procedures and Diagnostic Studies:   DG Chest 2 View  Result Date: 04/08/2021 CLINICAL DATA:   Weakness EXAM: CHEST - 2 VIEW COMPARISON:  09/07/2020 FINDINGS: Airspace disease at the left lung base. Probable small left effusion. Normal cardiac size. Aortic atherosclerosis. No pneumothorax. IMPRESSION: Left lung base consolidation, probably representing pneumonia. Small left effusion. Recommend radiographic follow-up to resolution. Electronically Signed   By: Jasmine Pang M.D.   On: 04/08/2021 19:48   CT Head Wo Contrast  Result Date: 04/08/2021 CLINICAL DATA:  Altered mental status.  COVID positive last week. EXAM: CT HEAD WITHOUT CONTRAST TECHNIQUE: Contiguous axial images were obtained from the base of the skull through the vertex without intravenous contrast. COMPARISON:  Brain MRI 09/30/2020 FINDINGS: Brain: No evidence  of acute infarction, hemorrhage, hydrocephalus, extra-axial collection or mass lesion/mass effect. Generalized atrophy is stable from prior exam. There is advanced periventricular and deep chronic small vessel ischemia, also stable. 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 Vascular: No hyperdense vessel Skull: No fracture or focal lesion. Sinuses/Orbits: Occasional mucosal thickening of ethmoid air cells and left side of sphenoid sinus. No sinus fluid levels. No mastoid effusion. Bilateral cataract resection Other: None. IMPRESSION: 1. No acute intracranial abnormality. 2. Stable atrophy and chronic small vessel ischemia. Electronically Signed   By: Narda Rutherford M.D.   On: 04/08/2021 22:41   CT Angio Chest PE W and/or Wo Contrast  Result Date: 04/08/2021 CLINICAL DATA:  High probability pulmonary embolism, COVID positive, malaise EXAM: CT ANGIOGRAPHY CHEST WITH CONTRAST TECHNIQUE: Multidetector CT imaging of the chest was performed using the standard protocol during bolus administration of intravenous contrast. Multiplanar CT image reconstructions and MIPs were obtained to evaluate the vascular anatomy. CONTRAST:  OMNIPAQUE IOHEXOL  350 MG/ML SOLN COMPARISON:  None. FINDINGS: Cardiovascular: There is adequate opacification of the pulmonary arterial tree. No intraluminal filling defect identified to suggest acute pulmonary embolism. The central pulmonary arteries are of normal caliber. Minimal coronary artery calcification. Global cardiac size within normal limits. Moderate mixed atheromatous plaque within the thoracic aorta. The thoracic aorta is mildly dilated at the arch just beyond the takeoff of the left subclavian artery measuring 3.3 cm in diameter on coronal image # 46/7. Mediastinum/Nodes: Small hiatal hernia. The esophagus is unremarkable. Thyroid unremarkable. No pathologic thoracic adenopathy. Lungs/Pleura: Moderate apically predominant centrilobular emphysema. There is complete collapse of the left lower lobe. No focal pulmonary infiltrate or nodule identified. No pneumothorax or pleural effusion. No definite central obstructing mass identified. There is impaction of the central airways of the left lower lobe. Upper Abdomen: No acute abnormality. Musculoskeletal: No acute bone abnormality. No lytic or blastic bone lesion identified. Review of the MIP images confirms the above findings. IMPRESSION: No acute pulmonary embolism. Moderate centrilobular emphysema. Complete collapse of the left lower lobe with central airway impaction. No central obstructing mass lesion. 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 Recommend annual imaging followup by CTA or MRA. This recommendation follows 2010 ACCF/AHA/AATS/ACR/ASA/SCA/SCAI/SIR/STS/SVM Guidelines for the Diagnosis and Management of Patients with Thoracic Aortic Disease. Circulation.2010; 121: K998-P382. Aortic aneurysm NOS (ICD10-I71.9) Mild coronary artery calcification. Aortic Atherosclerosis (ICD10-I70.0) and Emphysema (ICD10-J43.9). Aortic aneurysm NOS (ICD10-I71.9). Electronically Signed   By: Helyn Numbers MD   On: 04/08/2021 22:46     Labs:   Basic  Metabolic Panel: Recent Labs  Lab 04/08/21 1916 04/09/21 0553 04/10/21 0839 04/13/21 0514  NA 145 147* 141 136  K 3.7 3.7 3.7 2.9*  CL 109 111 107 106  CO2 23 26 24 22   GLUCOSE 130* 132* 120* 97  BUN 42* 40* 38* 21  CREATININE 1.05* 0.95 0.89 0.90  CALCIUM 10.5* 10.4* 9.8 8.8*   GFR Estimated Creatinine Clearance: 49 mL/min (by C-G formula based on SCr of 0.9 mg/dL). Liver Function Tests: Recent Labs  Lab 04/08/21 2136 04/09/21 0553  AST 26 21  ALT 13 12  ALKPHOS 45 42  BILITOT 1.4* 1.3*  PROT 8.0 7.7  ALBUMIN 4.1 3.9   No results for input(s): LIPASE, AMYLASE in the last 168 hours. No results for input(s): AMMONIA in the last 168 hours. Coagulation profile No results for input(s): INR, PROTIME in the last 168 hours.  CBC: Recent Labs  Lab  04/08/21 1916 04/09/21 0553  WBC 7.8 8.6  HGB 13.4 13.1  HCT 41.3 39.8  MCV 101.5* 100.8*  PLT 231 214   Cardiac Enzymes: No results for input(s): CKTOTAL, CKMB, CKMBINDEX, TROPONINI in the last 168 hours. BNP: Invalid input(s): POCBNP CBG: No results for input(s): GLUCAP in the last 168 hours. D-Dimer No results for input(s): DDIMER in the last 72 hours. Hgb A1c No results for input(s): HGBA1C in the last 72 hours. Lipid Profile No results for input(s): CHOL, HDL, LDLCALC, TRIG, CHOLHDL, LDLDIRECT in the last 72 hours. Thyroid function studies No results for input(s): TSH, T4TOTAL, T3FREE, THYROIDAB in the last 72 hours.  Invalid input(s): FREET3 Anemia work up No results for input(s): VITAMINB12, FOLATE, FERRITIN, TIBC, IRON, RETICCTPCT in the last 72 hours. Microbiology No results found for this or any previous visit (from the past 240 hour(s)).   Signed: Melina Schools Cheryn Lundquist  [facility] 04/13/2021, 11:07 AM

## 2021-04-13 NOTE — TOC Transition Note (Signed)
Transition of Care [facility]) - CM/SW Discharge Note   Patient Details  Name: Sara Ho MRN: [id number] Date of Birth: [birth year]  Transition of Care [facility]) CM/SW Contact:  Gildardo Griffes, LCSW Phone Number: 04/13/2021, 11:14 AM   Clinical Narrative:     [facility] to[facility] Anticipated DC date: 04/13/21 Family notified:daughter Rosey Bath Transport by: daughter Rosey Bath (will arrive at 1pm to pick up)  Per MD patient ready for DC to [facility] . RN, patient, patient's family, and facility notified of DC. Discharge Summary sent to facility at [phone].  Patient daughter Rosey Bath to pick patient up from hospital around 1pm to transport back to [facility].  CSW signing off.  Angeline Slim, LCSW    Final next level of care: Assisted Living Barriers to Discharge: No Barriers Identified   Patient Goals and CMS Choice   CMS Medicare.gov Compare Post Acute Care list provided to:: Patient Represenative (must comment) (daughter) Choice offered to / list presented to : Adult Children  Discharge Placement              Patient chooses bed at:  [facility]) Patient to be transferred to facility by: daughter Name of family member notified: daughter Rosey Bath Patient and family notified of of transfer: 04/13/21  Discharge Plan and Services                                     Social Determinants of Health (SDOH) Interventions     Readmission Risk Interventions No flowsheet data found.

## 2021-09-03 ENCOUNTER — Emergency Department: Payer: Medicare Other

## 2021-09-03 ENCOUNTER — Emergency Department
Admission: EM | Admit: 2021-09-03 | Discharge: 2021-09-03 | Disposition: A | Discharge status: Home / Self Care | Payer: Medicare Other | Attending: Emergency Medicine | Admitting: Emergency Medicine

## 2021-09-03 ENCOUNTER — Other Ambulatory Visit: Payer: Self-pay

## 2021-09-03 ENCOUNTER — Encounter: Payer: Self-pay | Admitting: Emergency Medicine

## 2021-09-03 DIAGNOSIS — I129 Hypertensive chronic kidney disease with stage 1 through stage 4 chronic kidney disease, or unspecified chronic kidney disease: Secondary | ICD-10-CM | POA: Insufficient documentation

## 2021-09-03 DIAGNOSIS — S2232XA Fracture of one rib, left side, initial encounter for closed fracture: Secondary | ICD-10-CM | POA: Diagnosis not present

## 2021-09-03 DIAGNOSIS — N1831 Chronic kidney disease, stage 3a: Secondary | ICD-10-CM | POA: Diagnosis not present

## 2021-09-03 DIAGNOSIS — Z79899 Other long term (current) drug therapy: Secondary | ICD-10-CM | POA: Diagnosis not present

## 2021-09-03 DIAGNOSIS — S299XXA Unspecified injury of thorax, initial encounter: Secondary | ICD-10-CM | POA: Diagnosis present

## 2021-09-03 DIAGNOSIS — F0390 Unspecified dementia without behavioral disturbance: Secondary | ICD-10-CM | POA: Diagnosis not present

## 2021-09-03 DIAGNOSIS — X58XXXA Exposure to other specified factors, initial encounter: Secondary | ICD-10-CM | POA: Insufficient documentation

## 2021-09-03 DIAGNOSIS — Z7982 Long term (current) use of aspirin: Secondary | ICD-10-CM | POA: Insufficient documentation

## 2021-09-03 DIAGNOSIS — Z8616 Personal history of COVID-19: Secondary | ICD-10-CM | POA: Diagnosis not present

## 2021-09-03 LAB — CBC WITH DIFFERENTIAL/PLATELET
Abs Immature Granulocytes: 0.02 10*3/uL (ref 0.00–0.07)
Basophils Absolute: 0.1 10*3/uL (ref 0.0–0.1)
Basophils Relative: 1 %
Eosinophils Absolute: 0.6 10*3/uL — ABNORMAL HIGH (ref 0.0–0.5)
Eosinophils Relative: 7 %
HCT: 38.7 % (ref 36.0–46.0)
Hemoglobin: 12.5 g/dL (ref 12.0–15.0)
Immature Granulocytes: 0 %
Lymphocytes Relative: 22 %
Lymphs Abs: 1.9 10*3/uL (ref 0.7–4.0)
MCH: 31.5 pg (ref 26.0–34.0)
MCHC: 32.3 g/dL (ref 30.0–36.0)
MCV: 97.5 fL (ref 80.0–100.0)
Monocytes Absolute: 0.7 10*3/uL (ref 0.1–1.0)
Monocytes Relative: 8 %
Neutro Abs: 5.2 10*3/uL (ref 1.7–7.7)
Neutrophils Relative %: 62 %
Platelets: 255 10*3/uL (ref 150–400)
RBC: 3.97 MIL/uL (ref 3.87–5.11)
RDW: 13 % (ref 11.5–15.5)
WBC: 8.5 10*3/uL (ref 4.0–10.5)
nRBC: 0 % (ref 0.0–0.2)

## 2021-09-03 LAB — COMPREHENSIVE METABOLIC PANEL
ALT: 11 U/L (ref 0–44)
AST: 24 U/L (ref 15–41)
Albumin: 3.9 g/dL (ref 3.5–5.0)
Alkaline Phosphatase: 49 U/L (ref 38–126)
Anion gap: 10 (ref 5–15)
BUN: 23 mg/dL (ref 8–23)
CO2: 23 mmol/L (ref 22–32)
Calcium: 10.1 mg/dL (ref 8.9–10.3)
Chloride: 105 mmol/L (ref 98–111)
Creatinine, Ser: 1.34 mg/dL — ABNORMAL HIGH (ref 0.44–1.00)
GFR, Estimated: 41 mL/min — ABNORMAL LOW (ref >60)
Glucose, Bld: 134 mg/dL — ABNORMAL HIGH (ref 70–99)
Potassium: 3.8 mmol/L (ref 3.5–5.1)
Sodium: 138 mmol/L (ref 135–145)
Total Bilirubin: 0.6 mg/dL (ref 0.3–1.2)
Total Protein: 7.6 g/dL (ref 6.5–8.1)

## 2021-09-03 LAB — URINALYSIS, ROUTINE W REFLEX MICROSCOPIC
Bilirubin Urine: NEGATIVE
Glucose, UA: NEGATIVE mg/dL
Hgb urine dipstick: NEGATIVE
Ketones, ur: NEGATIVE mg/dL
Nitrite: NEGATIVE
Protein, ur: NEGATIVE mg/dL
Specific Gravity, Urine: 1.01 (ref 1.005–1.030)
pH: 6.5 (ref 5.0–8.0)

## 2021-09-03 LAB — URINALYSIS, MICROSCOPIC (REFLEX): WBC, UA: >50 WBC/hpf (ref 0–5)

## 2021-09-03 LAB — TROPONIN I (HIGH SENSITIVITY): Troponin I (High Sensitivity): 6 ng/L (ref <18)

## 2021-09-03 LAB — LIPASE, BLOOD: Lipase: 36 U/L (ref 11–51)

## 2021-09-03 MED ORDER — OXYCODONE-ACETAMINOPHEN 5-325 MG PO TABS: 1.0000 | ORAL_TABLET | Freq: Four times a day (QID) | ORAL | 0 refills | Status: AC | PRN

## 2021-09-03 MED ORDER — ACETAMINOPHEN 500 MG PO TABS
1000.0000 mg | ORAL_TABLET | Freq: Once | ORAL | Status: AC
  Administered 2021-09-03: 1000 mg via ORAL
  Filled 2021-09-03: qty 2

## 2021-09-03 NOTE — ED Triage Notes (Signed)
Pt comes into the ED via POV with her daughter c/o left side rib pain that is tender to palpation.  Pt denies any falls or any cough that she has had.  Pt's pain was found in her PT evaluation at [facility] today.  Pt ambulatory to triage at this time with even and unlabored respirations.

## 2021-09-03 NOTE — ED Provider Notes (Signed)
[facility] Emergency Department Provider Note  ____________________________________________  Time seen: Approximately 6:24 PM  I have reviewed the triage vital signs and the nursing notes.   HISTORY  Chief Complaint Rib pain    HPI Sara Ho is a 80 y.o. female who presents to the emergency department with her daughter for complaint of left-sided chest/rib pain.  According to the daughter the patient started complaining of pain today.  There is no known trauma, however patient does have dementia and could have sustained an injury to the left chest wall.  No increased work of breathing or shortness of breath.  No cardiac history.  There is no GI complaints to include emesis, diarrhea or constipation.  No urinary changes.  Patient is hurting from the left anterior chest through the left posterior chest.  Denies tenderness to palpation.       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 Authorizing Provider  oxyCODONE-acetaminophen (PERCOCET/ROXICET) 5-325 MG tablet Take 1 tablet by mouth every 6 (six) hours as needed for severe pain. 09/03/21  Yes Arshia Spellman, Charline Bills, PA-C  acetaminophen (TYLENOL) 500 MG tablet Take 1,000 mg by mouth in the morning and at bedtime.    [provider]   albuterol (VENTOLIN HFA) 108 (90 Base) MCG/ACT inhaler Inhale 2 puffs into the lungs 2 (two) times daily. 04/13/21 07/12/21  Terrilee Croak, MD  amLODipine (NORVASC) 10 MG tablet Take 1 tablet (10 mg total) by mouth daily. 04/14/21 07/13/21  Terrilee Croak, MD  aspirin 81 MG EC tablet Take 81 mg by mouth daily.    [provider]  Calcium Carb-Cholecalciferol 600-400 MG-UNIT TABS Take 1 tablet by mouth in the morning and at bedtime.    [provider]  cyanocobalamin 1000 MCG tablet Take 1,000 mcg by mouth daily.    [provider]  gabapentin (NEURONTIN) 600 MG tablet Take 600 mg by mouth in the morning and at bedtime. 07/12/16   [provider]  guaiFENesin-dextromethorphan (ROBITUSSIN DM) 100-10 MG/5ML syrup Take 10 mLs by mouth every 4 (four) hours as needed for cough. 04/13/21   Terrilee Croak, MD  ipratropium (ATROVENT) 0.03 % nasal spray Place 1 spray into the nose 2 (two) times daily as needed. 03/16/17   [provider]  oxybutynin (DITROPAN-XL) 10 MG 24 hr tablet Take 1 tablet by mouth daily. 03/22/17   [provider]  simvastatin (ZOCOR) 10 MG tablet Take 1 tablet (10 mg total) by mouth daily. 04/14/21 07/13/21  Terrilee Croak, MD    Allergies Hydrocodone bit-homatrop mbr  History reviewed. No pertinent family history.  Social History Social History   Tobacco Use   Smoking status: Never   Smokeless tobacco: Never  Substance Use Topics   Alcohol use: Not Currently   Drug use: Not Currently  Review of Systems  Constitutional: No fever/chills Eyes: No visual changes. No discharge ENT: No upper respiratory complaints. Cardiovascular: Left-sided chest pain. Respiratory: no cough. No SOB. Gastrointestinal: No abdominal pain.  No nausea, no vomiting.  No diarrhea.  No constipation. Genitourinary: Negative for dysuria. No hematuria Musculoskeletal: Left rib pain Skin: Negative for rash, abrasions, lacerations,  ecchymosis. Neurological: Negative for headaches, focal weakness or numbness.  10 System ROS otherwise negative.  ____________________________________________   PHYSICAL EXAM:  VITAL SIGNS: ED Triage Vitals  Enc Vitals Group     BP 09/03/21 1814 (!) 159/81     Pulse Rate 09/03/21 1814 88     Resp 09/03/21 1814 18     Temp 09/03/21 1814 97.9 F (36.6 C)     Temp Source 09/03/21 1814 Oral     SpO2 09/03/21 1814 100 %     Weight 09/03/21 1807 139 lb 15.9 oz (63.5 kg)     Height 09/03/21 1807 5\' 6"  (1.676 m)     Head Circumference --      Peak Flow --      Pain Score 09/03/21 1807 10     Pain Loc --      Pain Edu? --      Excl. in GC? --      Constitutional: Alert and oriented. Well appearing and in no acute distress. Eyes: Conjunctivae are normal. PERRL. EOMI. Head: Atraumatic. ENT:      Ears:       Nose: No congestion/rhinnorhea.      Mouth/Throat: Mucous membranes are moist.  Neck: No stridor.    Cardiovascular: Normal rate, regular rhythm. Normal S1 and S2.  No appreciable murmurs, rubs, gallops good peripheral circulation. Respiratory: Normal respiratory effort without tachypnea or retractions. Lungs CTAB. Good air entry to the bases with no decreased or absent breath sounds. Gastrointestinal: Bowel sounds 4 quadrants. Soft and nontender to palpation. No guarding or rigidity. No palpable masses. No distention. No CVA tenderness. Musculoskeletal: Full range of motion to all extremities. No gross deformities appreciated.  Palpation along the left chest wall reveals significant tenderness.  This occurs from ribs 8 through 11 originating along the anterior chest traveling laterally into the posterior chest.  Good underlying breath sounds bilaterally. Neurologic:  Normal speech and language. No gross focal neurologic deficits are appreciated.  Skin:  Skin is warm, dry and intact. No rash noted. Psychiatric: Mood and affect are normal. Speech and behavior are normal. Patient  exhibits appropriate insight and judgement.   ____________________________________________   LABS (all labs ordered are listed, but only abnormal results are displayed)  Labs Reviewed  COMPREHENSIVE METABOLIC PANEL - Abnormal; Notable for the following components:      Result Value   Glucose, Bld 134 (*)    Creatinine, Ser 1.34 (*)    GFR, Estimated 41 (*)    All other components within normal limits  CBC WITH DIFFERENTIAL/PLATELET - Abnormal; Notable for the following components:   Eosinophils Absolute 0.6 (*)    All other components within normal limits  URINALYSIS, ROUTINE W REFLEX MICROSCOPIC - Abnormal; Notable for the following components:   APPearance HAZY (*)    Leukocytes,Ua LARGE (*)    All other components within normal limits  URINALYSIS, MICROSCOPIC (REFLEX) - Abnormal; Notable for the following components:   Bacteria, UA RARE (*)    All other components within normal limits  LIPASE, BLOOD  TROPONIN I (HIGH SENSITIVITY)  TROPONIN I (HIGH SENSITIVITY)   ____________________________________________  EKG  ____________________________________________  RADIOLOGY I personally viewed and evaluated these images as part of my medical decision making, as well as reviewing the written report by the radiologist.  ED Provider Interpretation: Ninth rib fracture  DG Ribs Unilateral W/Chest Left  Result Date: 09/03/2021 CLINICAL DATA:  Trauma, pain EXAM: LEFT RIBS AND CHEST - 3+ VIEW COMPARISON:  Chest radiographs done on 04/08/2021 FINDINGS: Cardiac size is within normal limits. There is improvement in aeration of left lower lung fields with small residual linear densities in both lower lung fields suggesting scarring or subsegmental atelectasis. There are no signs of alveolar pulmonary edema or new focal infiltrates. There is no pleural effusion or pneumothorax. There is break in the cortical margin in the anterior end of left ninth rib. There may be other undisplaced  fractures in the adjacent ribs. IMPRESSION: Undisplaced fracture is seen in the anterior end of left ninth rib. There is no definite evidence of pulmonary contusion. There is no pleural effusion or pneumothorax. Small linear densities in both lower lung fields may suggest scarring or subsegmental atelectasis. Electronically Signed   By: Ernie Avena M.D.   On: 09/03/2021 19:21    ____________________________________________    PROCEDURES  Procedure(s) performed:    Procedures    Medications - No data to display   ____________________________________________   INITIAL IMPRESSION / ASSESSMENT AND PLAN / ED COURSE  Pertinent labs & imaging results that were available during my care of the patient were reviewed by me and considered in my medical decision making (see chart for details).  Review of the [place] CSRS was performed in accordance of the [facility] prior to dispensing any controlled drugs.           Patient's diagnosis is consistent with rib fracture.  Patient presents with left-sided chest/chest wall pain.  No reported trauma but patient does have dementia and is not able to completely provide an accurate history.  Daughter states that she does not know of any other trauma.  There is no other complaints.  No recent coughing.  Patient had labs, EKG, imaging.  Patient does have a solitary left rib fracture with no other significant findings at this time.  Patient will be provided with a prescription for Percocet if patient becomes severely symptomatic but daughter will control prescription to ensure that patient is not taking it, becoming drowsy and unsteady and sustaining further injuries.  Follow-up with primary care as needed.  Return precautions discussed with the patient's daughter..  Patient is given ED precautions to return to the ED for any worsening or new symptoms.     ____________________________________________  FINAL CLINICAL IMPRESSION(S) / ED DIAGNOSES  Final  diagnoses:  Closed fracture of one rib of left side, initial encounter      NEW MEDICATIONS STARTED DURING THIS VISIT:  ED Discharge Orders          Ordered    oxyCODONE-acetaminophen (PERCOCET/ROXICET) 5-325 MG tablet  Every 6 hours PRN        09/03/21 2057                This chart was dictated using voice recognition software/Dragon. Despite best efforts to proofread, errors can occur which can change the meaning. Any change was purely unintentional.    Racheal Patches, PA-C 09/03/21 2058    Gilles Chiquito, MD 09/04/21 [phone]

## 2021-09-28 IMAGING — CT CT ANGIO CHEST
2 of 6 series · 18 of 46 positions shown · IV contrast (APPLIED)
Comparison: None.

CLINICAL DATA: High probability pulmonary embolism, COVID positive,
malaise

EXAM:
CT ANGIOGRAPHY CHEST WITH CONTRAST
TECHNIQUE: Multidetector CT imaging of the chest was performed using the
standard protocol during bolus administration of intravenous
contrast. Multiplanar CT image reconstructions and MIPs were
obtained to evaluate the vascular anatomy.
CONTRAST:  100mL OMNIPAQUE IOHEXOL 350 MG/ML SOLN

[Series 5: thins · axial · 0.62mm/px · z∈[-550,-278]mm · 15 of 372 slices shown]
[im 16/372  lung]
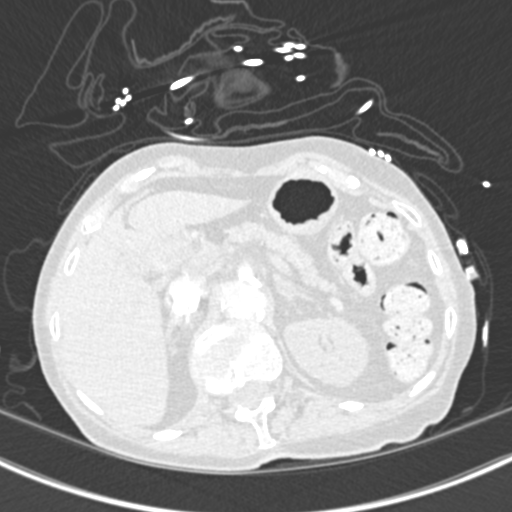
[im 47/372  soft-tissue]
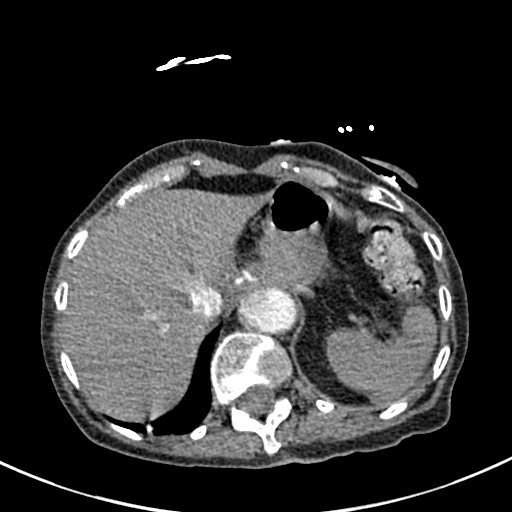
[im 62/372  lung]
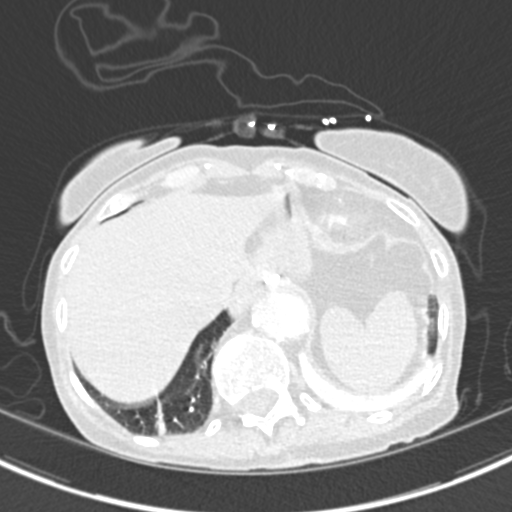
[im 93/372  soft-tissue]
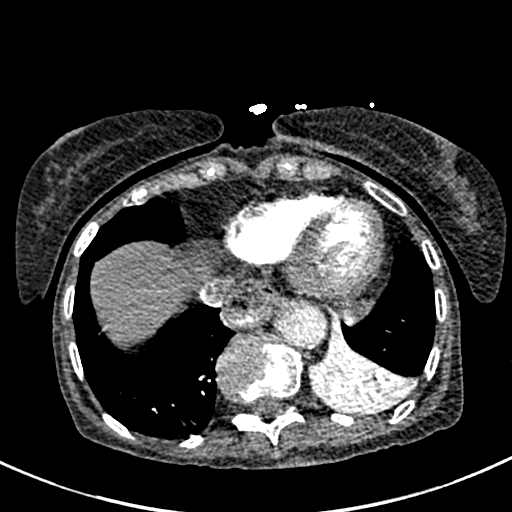
[im 109/372  lung]
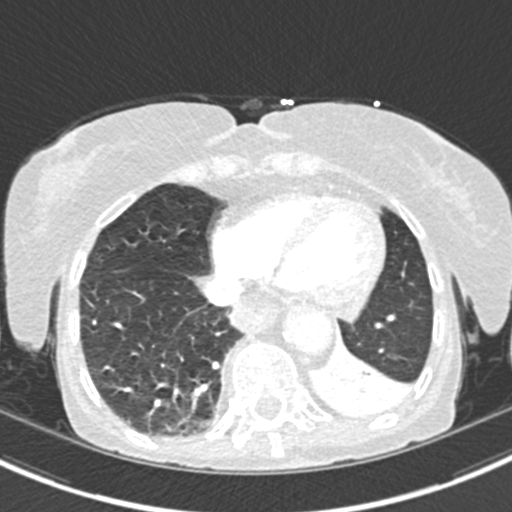
[im 140/372  soft-tissue]
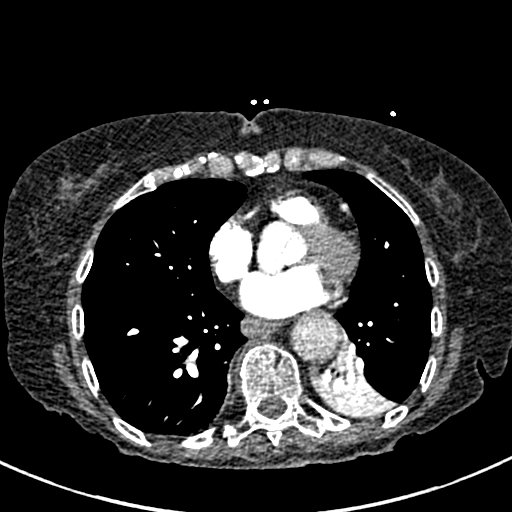
[im 155/372  lung]
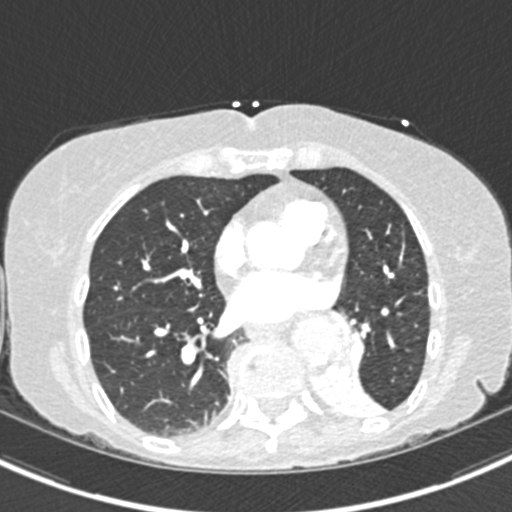
[im 186/372  soft-tissue]
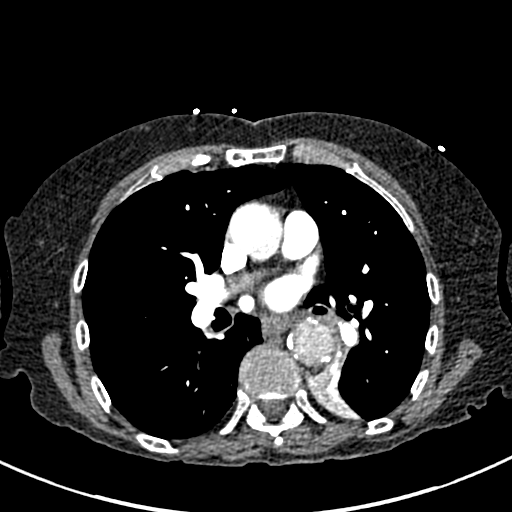
[im 217/372  lung]
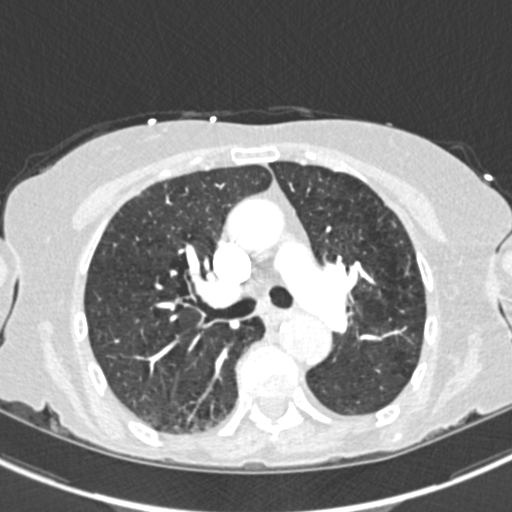
[im 232/372  soft-tissue]
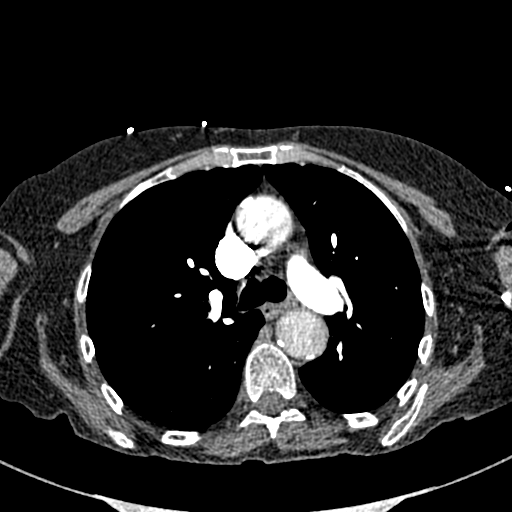
[im 263/372  lung]
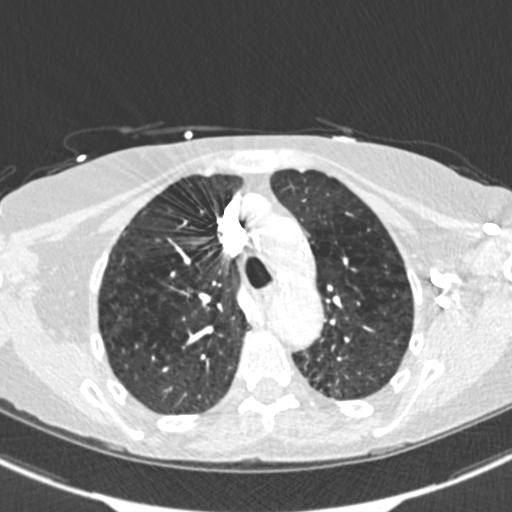
[im 279/372  soft-tissue]
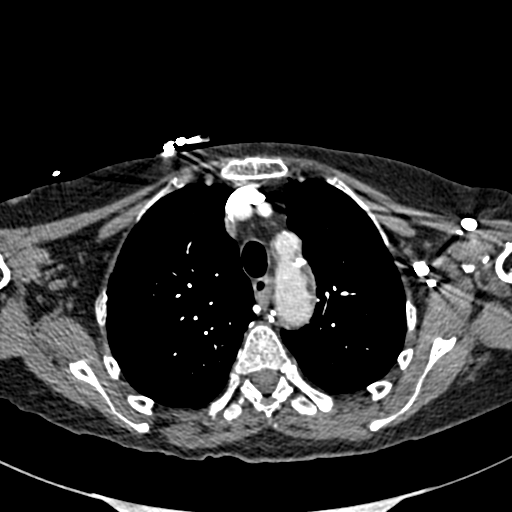
[im 310/372  lung]
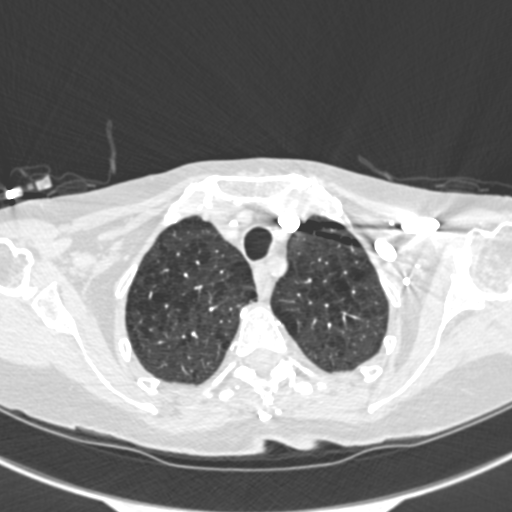
[im 325/372  soft-tissue]
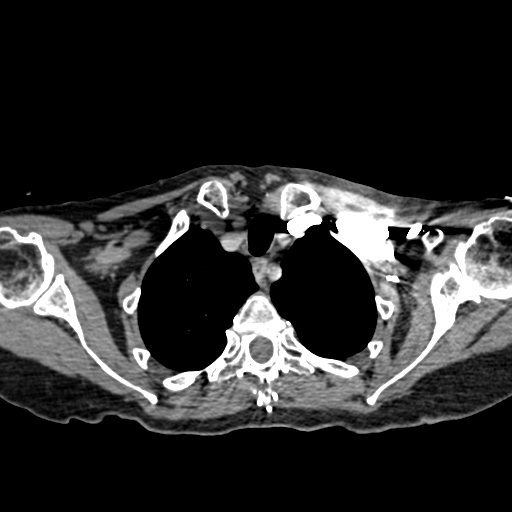
[im 356/372  lung]
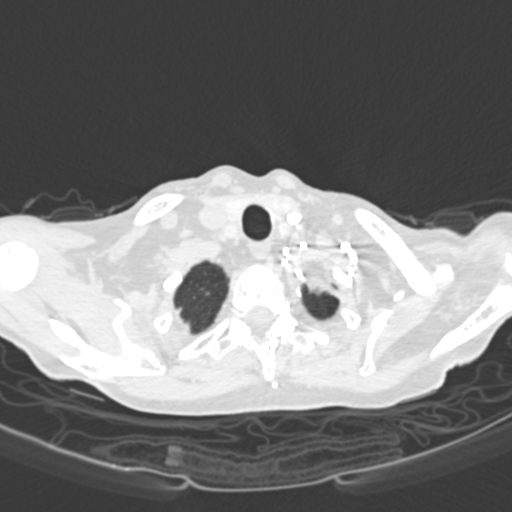

[Series 7: coronal mpr · coronal · 0.54mm/px · 3 of 80 slices shown]
[im 20/80  soft-tissue]
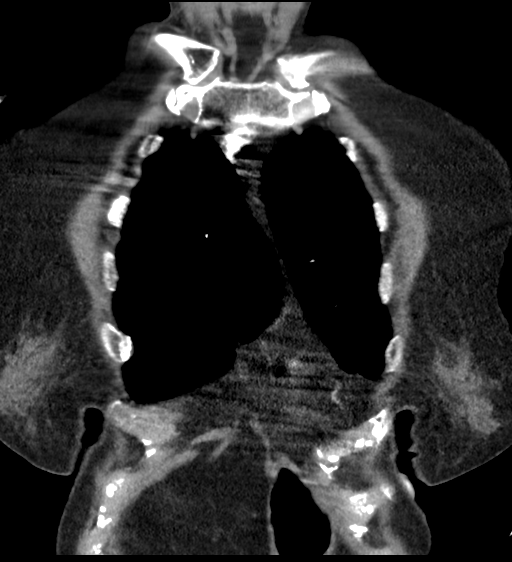
[im 40/80  soft-tissue]
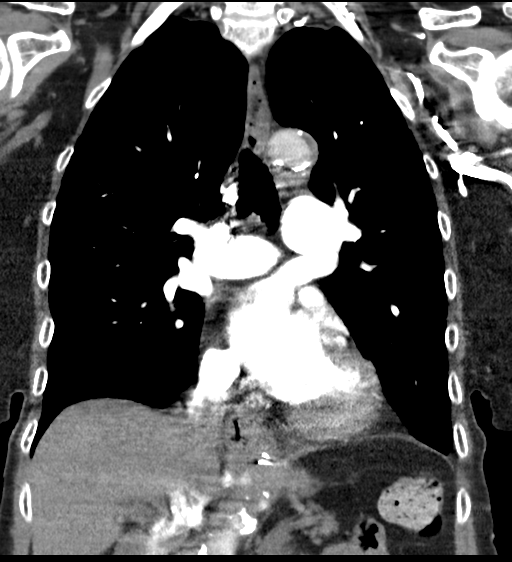
[im 60/80  soft-tissue]
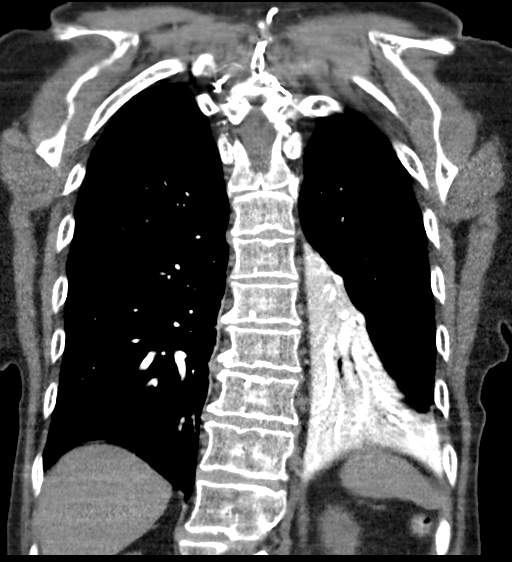

[18 of 46 positions shown; findings below may reference images not displayed]

FINDINGS: Cardiovascular: There is adequate opacification of the pulmonary
arterial tree. No intraluminal filling defect identified to suggest
acute pulmonary embolism. The central pulmonary arteries are of
normal caliber.

Minimal coronary artery calcification. Global cardiac size within
normal limits. Moderate mixed atheromatous plaque within the
thoracic aorta. The thoracic aorta is mildly dilated at the arch
just beyond the takeoff of the left subclavian artery measuring
cm in diameter on coronal image # 46/7.

Mediastinum/Nodes: Small hiatal hernia. The esophagus is
unremarkable. Thyroid unremarkable. No pathologic thoracic
adenopathy.

Lungs/Pleura: Moderate apically predominant centrilobular emphysema.
There is complete collapse of the left lower lobe. No focal
pulmonary infiltrate or nodule identified. No pneumothorax or
pleural effusion. No definite central obstructing mass identified.
There is impaction of the central airways of the left lower lobe.

Upper Abdomen: No acute abnormality.

Musculoskeletal: No acute bone abnormality. No lytic or blastic bone
lesion identified.

Review of the MIP images confirms the above findings.
IMPRESSION: No acute pulmonary embolism.

Moderate centrilobular emphysema.

Complete collapse of the left lower lobe with central airway
impaction. No central obstructing mass lesion.

Dilation of the aortic arch measuring 3.3 cm in greatest dimension.
Recommend annual imaging followup by CTA or MRA. This recommendation
follows 3979 ACCF/AHA/AATS/ACR/ASA/SCA/AMNON/BASSEY/YAKI/TG Guidelines
for the Diagnosis and Management of Patients with Thoracic Aortic
Disease. Circulation.3979; 121: E266-e369. Aortic aneurysm NOS
(883J7-FMZ.5)

Mild coronary artery calcification.

Aortic Atherosclerosis (883J7-1XD.D) and Emphysema (883J7-RJK.Z).
Aortic aneurysm NOS (883J7-FMZ.5).

## 2022-02-18 ENCOUNTER — Other Ambulatory Visit: Payer: Self-pay | Admitting: Student

## 2022-02-18 DIAGNOSIS — Z87891 Personal history of nicotine dependence: Secondary | ICD-10-CM

## 2022-02-18 DIAGNOSIS — J432 Centrilobular emphysema: Secondary | ICD-10-CM

## 2022-02-18 DIAGNOSIS — I712 Thoracic aortic aneurysm, without rupture, unspecified: Secondary | ICD-10-CM

## 2022-03-18 ENCOUNTER — Other Ambulatory Visit: Payer: Self-pay | Admitting: Sports Medicine

## 2022-03-18 DIAGNOSIS — M5416 Radiculopathy, lumbar region: Secondary | ICD-10-CM

## 2022-03-18 DIAGNOSIS — M5136 Other intervertebral disc degeneration, lumbar region: Secondary | ICD-10-CM

## 2022-04-09 ENCOUNTER — Ambulatory Visit: Admission: RE | Admit: 2022-04-09 | Payer: Medicare Other | Source: Ambulatory Visit

## 2022-04-09 ENCOUNTER — Ambulatory Visit
Admission: RE | Admit: 2022-04-09 | Discharge: 2022-04-09 | Disposition: A | Discharge status: Home / Self Care | Payer: Medicare Other | Source: Ambulatory Visit | Attending: Sports Medicine | Admitting: Sports Medicine

## 2022-04-09 DIAGNOSIS — M5136 Other intervertebral disc degeneration, lumbar region: Secondary | ICD-10-CM

## 2022-04-09 DIAGNOSIS — M5416 Radiculopathy, lumbar region: Secondary | ICD-10-CM
